# Patient Record
Sex: Male | Born: 1955
Health system: Southern US, Community
[De-identification: ages and names within clinical notes are randomized; demographics above are authoritative.]

## PROBLEM LIST (undated history)

## (undated) DIAGNOSIS — K760 Fatty (change of) liver, not elsewhere classified: Secondary | ICD-10-CM

## (undated) DIAGNOSIS — Z8619 Personal history of other infectious and parasitic diseases: Secondary | ICD-10-CM

## (undated) DIAGNOSIS — Z923 Personal history of irradiation: Secondary | ICD-10-CM

## (undated) DIAGNOSIS — Z86010 Personal history of colonic polyps: Secondary | ICD-10-CM

## (undated) DIAGNOSIS — E119 Type 2 diabetes mellitus without complications: Secondary | ICD-10-CM

## (undated) DIAGNOSIS — F17200 Nicotine dependence, unspecified, uncomplicated: Secondary | ICD-10-CM

## (undated) DIAGNOSIS — I10 Essential (primary) hypertension: Secondary | ICD-10-CM

## (undated) DIAGNOSIS — E785 Hyperlipidemia, unspecified: Secondary | ICD-10-CM

## (undated) DIAGNOSIS — M1990 Unspecified osteoarthritis, unspecified site: Secondary | ICD-10-CM

## (undated) DIAGNOSIS — C61 Malignant neoplasm of prostate: Secondary | ICD-10-CM

## (undated) DIAGNOSIS — G2581 Restless legs syndrome: Secondary | ICD-10-CM

## (undated) DIAGNOSIS — N529 Male erectile dysfunction, unspecified: Secondary | ICD-10-CM

## (undated) HISTORY — DX: Male erectile dysfunction, unspecified: N52.9

## (undated) HISTORY — DX: Restless legs syndrome: G25.81

## (undated) HISTORY — DX: Essential (primary) hypertension: I10

## (undated) HISTORY — DX: Nicotine dependence, unspecified, uncomplicated: F17.200

## (undated) HISTORY — DX: Type 2 diabetes mellitus without complications: E11.9

## (undated) HISTORY — DX: Hyperlipidemia, unspecified: E78.5

## (undated) HISTORY — DX: Personal history of colonic polyps: Z86.010

## (undated) HISTORY — DX: Malignant neoplasm of prostate: C61

## (undated) HISTORY — DX: Fatty (change of) liver, not elsewhere classified: K76.0

## (undated) HISTORY — DX: Unspecified osteoarthritis, unspecified site: M19.90

## (undated) HISTORY — PX: COLONOSCOPY: SHX174

## (undated) HISTORY — PX: CIRCUMCISION: SUR203

## (undated) HISTORY — DX: Personal history of other infectious and parasitic diseases: Z86.19

## (undated) HISTORY — PX: COLONOSCOPY W/ POLYPECTOMY: SHX1380

---

## 2012-02-18 HISTORY — PX: PROSTATE BIOPSY: SHX241

## 2014-03-07 ENCOUNTER — Ambulatory Visit (INDEPENDENT_AMBULATORY_CARE_PROVIDER_SITE_OTHER): Payer: Medicare Other | Admitting: Family Medicine

## 2014-03-07 ENCOUNTER — Encounter: Payer: Self-pay | Admitting: Family Medicine

## 2014-03-07 VITALS — BP 144/94 | HR 79 | Temp 97.0°F | Resp 18 | Ht 68.0 in | Wt 187.0 lb

## 2014-03-07 DIAGNOSIS — G2581 Restless legs syndrome: Secondary | ICD-10-CM | POA: Insufficient documentation

## 2014-03-07 DIAGNOSIS — E785 Hyperlipidemia, unspecified: Secondary | ICD-10-CM | POA: Insufficient documentation

## 2014-03-07 DIAGNOSIS — Z8619 Personal history of other infectious and parasitic diseases: Secondary | ICD-10-CM

## 2014-03-07 DIAGNOSIS — I10 Essential (primary) hypertension: Secondary | ICD-10-CM | POA: Insufficient documentation

## 2014-03-07 MED ORDER — ROPINIROLE HCL 0.5 MG PO TABS: 0.5000 mg | ORAL_TABLET | Freq: Three times a day (TID) | ORAL | Status: DC

## 2014-03-07 MED ORDER — BENAZEPRIL HCL 20 MG PO TABS: 20.0000 mg | ORAL_TABLET | Freq: Every day | ORAL | Status: DC

## 2014-03-07 NOTE — Progress Notes (Signed)
Office Note 03/07/2014  CC:  Chief Complaint  Patient presents with  . Establish Care    pt doesn't plan to stay at this office?    HPI:  John Sloan is a 58 y.o. Black male who is here to establish care. Patient's most recent primary MD: MD in [place] records were not reviewed prior to or during today's visit.  Monitoring bp at home: last few weeks >150 over 80s-90s.    Says ropinirole not working now, has been on it for about 10 yrs. About 3 yrs ago he noted it was not working as well.  His dose was increased but then in review of his current dosing he takes only a 0.25mg  tab qhs and sometimes a dose in morning and afternoon prn.   Past Medical History  Diagnosis Date  . HTN (hypertension)   . Hyperlipemia   . Erectile dysfunction   . Restless legs syndrome   . Osteoarthritis     Left knee, right hand, left shoulder--fluctuates with weather.   . History of adenomatous polyp of colon   . History of elevated PSA     Bx x 1 benign.    . Fatty liver     bx done per pt  . History of hepatitis C virus infection     treated 2014--cured per pt    Past Surgical History  Procedure Laterality Date  . Colonoscopy w/ polypectomy  approx 2012    1 polyp : recall 5 yrs    Family History  Problem Relation Age of Onset  . Hypertension Mother   . Hypertension Father     History   Social History  . Marital Status: Married    Spouse Name: N/A    Number of Children: N/A  . Years of Education: N/A   Occupational History  . Not on file.   Social History Main Topics  . Smoking status: Current Every Day Smoker -- 0.50 packs/day for 40 years    Types: Cigarettes  . Smokeless tobacco: Never Used  . Alcohol Use: Yes     Comment: socially  . Drug Use: No  . Sexual Activity: Not on file   Other Topics Concern  . Not on file   Social History Narrative   Married, 5 children, 21 grandchildren.   Relocated from [place] to [facility] 2015.   Occupation: disabled,  former Therapist, occupational.   20 pack-yr tob hx, current as of 03/2014.   Alcohol: drinks about 1 case per week.  Denies probs from his drinking.   Drugs: hx of cocaine and marijuana abuse.  (IV drug use in the past).   Still smokes marijuana occasionally.  No cocaine since 2009.             Outpatient Encounter Prescriptions as of 03/07/2014  Medication Sig  . amLODipine (NORVASC) 10 MG tablet Take 10 mg by mouth daily.  Marland Kitchen aspirin 81 MG tablet Take 81 mg by mouth daily.  . benazepril (LOTENSIN) 20 MG tablet Take 1 tablet (20 mg total) by mouth daily.  . hydrochlorothiazide (HYDRODIURIL) 25 MG tablet Take 25 mg by mouth daily.  Marland Kitchen lovastatin (MEVACOR) 40 MG tablet Take 40 mg by mouth at bedtime.  . naproxen (NAPROSYN) 500 MG tablet Take 500 mg by mouth 2 (two) times daily with a meal.  . rOPINIRole (REQUIP) 0.5 MG tablet Take 1 tablet (0.5 mg total) by mouth 3 (three) times daily.  . sildenafil (VIAGRA) 50 MG tablet Take 50 mg  by mouth as needed for erectile dysfunction.  . [DISCONTINUED] benazepril (LOTENSIN) 10 MG tablet Take 10 mg by mouth daily.  . [DISCONTINUED] rOPINIRole (REQUIP) 0.25 MG tablet Take 0.25 mg by mouth 3 (three) times daily.    No Known Allergies  ROS Review of Systems  Constitutional: Negative for fever and fatigue.  HENT: Negative for congestion and sore throat.   Eyes: Negative for visual disturbance.  Respiratory: Negative for cough and shortness of breath.   Cardiovascular: Negative for chest pain and palpitations.  Gastrointestinal: Negative for nausea and abdominal pain.  Genitourinary: Negative for dysuria.  Musculoskeletal: Negative for back pain and joint swelling.       Left shoulder and arm heaviness intermittently  Skin: Negative for rash.  Neurological: Negative for weakness and headaches.  Hematological: Negative for adenopathy.    PE; Blood pressure 144/94, pulse 79, temperature 97 F (36.1 C), temperature source Temporal, resp. rate 18,  height 5\' 8"  (1.727 m), weight 187 lb (84.823 kg), SpO2 100.00%. Gen: Alert, well appearing.  Patient is oriented to person, place, time, and situation. WVP:XTGG: no injection, icteris, swelling, or exudate.  EOMI, PERRLA. Mouth: lips without lesion/swelling.  Oral mucosa pink and moist. Oropharynx without erythema, exudate, or swelling.  CV: RRR, no m/r/g.   LUNGS: CTA bilat, nonlabored resps, good aeration in all lung fields. ABD: soft, NT, ND, BS normal.  No hepatospenomegaly or mass.  No bruits. EXT: no clubbing, cyanosis, or edema.   Pertinent labs:  None today  ASSESSMENT AND PLAN:   New pt: obtain old records.  Restless leg syndrome, uncontrolled Increase generic mirapex to 0.5mg  and take 1 tid prn.  May need to keep up-titrating this, particularly his hs dose.  HTN (hypertension), benign Not ideal control. Increase benazepril to 20mg  qd.  Continue 25mg  hctz and 10mg  norvasc. Check CMET today.  History of hepatitis C virus infection Will review old records. Pt states he has been treated and cured.  Hyperlipemia Tolerating statin. AST/ALT today.    An After Visit Summary was printed and given to the patient.  Return in about 3 weeks (around 03/28/2014) for f/u RLS and HTN and discuss left shoulder.

## 2014-03-07 NOTE — Assessment & Plan Note (Signed)
Will review old records. Pt states he has been treated and cured.

## 2014-03-07 NOTE — Assessment & Plan Note (Signed)
Increase generic mirapex to 0.5mg  and take 1 tid prn.  May need to keep up-titrating this, particularly his hs dose.

## 2014-03-07 NOTE — Assessment & Plan Note (Signed)
Not ideal control. Increase benazepril to 20mg  qd.  Continue 25mg  hctz and 10mg  norvasc. Check CMET today.

## 2014-03-07 NOTE — Assessment & Plan Note (Signed)
Tolerating statin. AST/ALT today.

## 2014-03-07 NOTE — Progress Notes (Signed)
Pre visit review using our clinic review tool, if applicable. No additional management support is needed unless otherwise documented below in the visit note. 

## 2014-03-08 ENCOUNTER — Encounter: Payer: Self-pay | Admitting: Family Medicine

## 2014-03-08 ENCOUNTER — Telehealth: Payer: Self-pay | Admitting: Family Medicine

## 2014-03-08 DIAGNOSIS — C61 Malignant neoplasm of prostate: Secondary | ICD-10-CM | POA: Insufficient documentation

## 2014-03-08 LAB — COMPREHENSIVE METABOLIC PANEL
ALK PHOS: 42 U/L (ref 39–117)
ALT: 35 U/L (ref 0–53)
AST: 34 U/L (ref 0–37)
Albumin: 4 g/dL (ref 3.5–5.2)
BUN: 10 mg/dL (ref 6–23)
CO2: 30 mEq/L (ref 19–32)
Calcium: 9.4 mg/dL (ref 8.4–10.5)
Chloride: 99 mEq/L (ref 96–112)
Creatinine, Ser: 1 mg/dL (ref 0.4–1.5)
GFR: 77.46 mL/min (ref >60.00)
Glucose, Bld: 133 mg/dL — ABNORMAL HIGH (ref 70–99)
Potassium: 3.3 mEq/L — ABNORMAL LOW (ref 3.5–5.1)
SODIUM: 136 meq/L (ref 135–145)
TOTAL PROTEIN: 7.7 g/dL (ref 6.0–8.3)
Total Bilirubin: 0.9 mg/dL (ref 0.2–1.2)

## 2014-03-08 LAB — CBC WITH DIFFERENTIAL/PLATELET
Basophils Absolute: 0 10*3/uL (ref 0.0–0.1)
Basophils Relative: 0.4 % (ref 0.0–3.0)
EOS ABS: 0.2 10*3/uL (ref 0.0–0.7)
Eosinophils Relative: 2.3 % (ref 0.0–5.0)
HCT: 45.6 % (ref 39.0–52.0)
Hemoglobin: 15 g/dL (ref 13.0–17.0)
Lymphocytes Relative: 30.3 % (ref 12.0–46.0)
Lymphs Abs: 2.3 10*3/uL (ref 0.7–4.0)
MCHC: 33 g/dL (ref 30.0–36.0)
MCV: 86.9 fl (ref 78.0–100.0)
MONO ABS: 0.6 10*3/uL (ref 0.1–1.0)
Monocytes Relative: 8.1 % (ref 3.0–12.0)
Neutro Abs: 4.5 10*3/uL (ref 1.4–7.7)
Neutrophils Relative %: 58.9 % (ref 43.0–77.0)
PLATELETS: 323 10*3/uL (ref 150.0–400.0)
RBC: 5.24 Mil/uL (ref 4.22–5.81)
RDW: 16.1 % — AB (ref 11.5–15.5)
WBC: 7.7 10*3/uL (ref 4.0–10.5)

## 2014-03-08 LAB — FERRITIN: Ferritin: 14.6 ng/mL — ABNORMAL LOW (ref 22.0–322.0)

## 2014-03-08 NOTE — Telephone Encounter (Signed)
Relevant patient education assigned to patient using Emmi. ° °

## 2014-03-09 ENCOUNTER — Ambulatory Visit: Payer: Medicare Other

## 2014-03-09 DIAGNOSIS — R7309 Other abnormal glucose: Secondary | ICD-10-CM

## 2014-03-09 LAB — HEMOGLOBIN A1C: HEMOGLOBIN A1C: 5.9 % (ref 4.6–6.5)

## 2014-03-30 ENCOUNTER — Ambulatory Visit: Payer: Medicare Other | Admitting: Family Medicine

## 2014-03-30 ENCOUNTER — Encounter: Payer: Self-pay | Admitting: Family Medicine

## 2014-03-30 ENCOUNTER — Ambulatory Visit (INDEPENDENT_AMBULATORY_CARE_PROVIDER_SITE_OTHER): Payer: Medicare Other | Admitting: Family Medicine

## 2014-03-30 VITALS — BP 119/79 | HR 60 | Temp 97.2°F | Resp 18 | Ht 68.0 in | Wt 193.0 lb

## 2014-03-30 DIAGNOSIS — G2581 Restless legs syndrome: Secondary | ICD-10-CM

## 2014-03-30 DIAGNOSIS — E876 Hypokalemia: Secondary | ICD-10-CM

## 2014-03-30 DIAGNOSIS — E785 Hyperlipidemia, unspecified: Secondary | ICD-10-CM

## 2014-03-30 DIAGNOSIS — I10 Essential (primary) hypertension: Secondary | ICD-10-CM

## 2014-03-30 DIAGNOSIS — E611 Iron deficiency: Secondary | ICD-10-CM

## 2014-03-30 DIAGNOSIS — D509 Iron deficiency anemia, unspecified: Secondary | ICD-10-CM

## 2014-03-30 LAB — BASIC METABOLIC PANEL
BUN: 14 mg/dL (ref 6–23)
CO2: 30 mEq/L (ref 19–32)
Calcium: 9.6 mg/dL (ref 8.4–10.5)
Chloride: 100 mEq/L (ref 96–112)
Creatinine, Ser: 1 mg/dL (ref 0.4–1.5)
GFR: 84.94 mL/min (ref >60.00)
GLUCOSE: 59 mg/dL — AB (ref 70–99)
POTASSIUM: 3.6 meq/L (ref 3.5–5.1)
SODIUM: 136 meq/L (ref 135–145)

## 2014-03-30 MED ORDER — ROPINIROLE HCL 0.5 MG PO TABS: ORAL_TABLET | ORAL | Status: DC

## 2014-03-30 MED ORDER — LOVASTATIN 40 MG PO TABS: 40.0000 mg | ORAL_TABLET | Freq: Every day | ORAL | Status: DC

## 2014-03-30 NOTE — Progress Notes (Signed)
OFFICE NOTE  03/30/2014  CC:  Chief Complaint  Patient presents with  . Follow-up  . Medication Problem    pt requesting 1mg  requip   HPI: Patient is a 58 y.o. African-American male who is here for 3 wk f/u uncontrolled HTN and RLS. Increased lotensin to 20mg  qd last visit. Home bp's 120s/70s.  HR avg mid 70s.  Requip is helping daytime RLS sx's but lately the 0.5mg  hs dose has not been sufficient. Describes "jumpy" left leg and left upper/medial thigh intermittent jabbing pains.  Right leg is w/out sx's. His iron was low so he is on [id number] bid and will go to qd dosing in about 1 wk. His Hb was normal.  Stool was brown prior to taking iron tabs: now it is black per pt.  Takes naproxen about 3 x/week for shoulder arthralgia and takes daily 81mg  ASA.   Pertinent PMH:  Past medical, surgical, social, and family history reviewed and no changes are noted since last office visit.  MEDS:  Outpatient Prescriptions Prior to Visit  Medication Sig Dispense Refill  . amLODipine (NORVASC) 10 MG tablet Take 10 mg by mouth daily.      Marland Kitchen aspirin 81 MG tablet Take 81 mg by mouth daily.      . benazepril (LOTENSIN) 20 MG tablet Take 1 tablet (20 mg total) by mouth daily.  30 tablet  6  . hydrochlorothiazide (HYDRODIURIL) 25 MG tablet Take 25 mg by mouth daily.      Marland Kitchen lovastatin (MEVACOR) 40 MG tablet Take 40 mg by mouth at bedtime.      . naproxen (NAPROSYN) 500 MG tablet Take 500 mg by mouth 2 (two) times daily with a meal.      . rOPINIRole (REQUIP) 0.5 MG tablet Take 1 tablet (0.5 mg total) by mouth 3 (three) times daily.  90 tablet  1  . sildenafil (VIAGRA) 50 MG tablet Take 50 mg by mouth as needed for erectile dysfunction.       No facility-administered medications prior to visit.    PE: Blood pressure 119/79, pulse 60, temperature 97.2 F (36.2 C), temperature source Temporal, resp. rate 18, height 5\' 8"  (1.727 m), weight 193 lb (87.544 kg), SpO2 100.00%. Gen: Alert, well  appearing.  Patient is oriented to person, place, time, and situation. CV: RRR, no m/r/g.   LUNGS: CTA bilat, nonlabored resps, good aeration in all lung fields. LEGS: strength 5/5 prox and dist bilat, DTRs in patella region 2+ bilat, No back or hip tenderness. I observed no muscle twitching or myoclonus today.  IMPRESSION AND PLAN:  1) HTN, controlled well now. Continue current meds.  2) mild hypokalemia.  I increased his dose of lotensin so we'll see if K in normal range now.  BMET today.  3) Hyperlipidemia: he can't recall last lipid check, no old records received yet from past PMD, not fasting today. RF lovastatin 40mg  qd today, recheck FLP next f/u in 1 mo.  4) Iron deficiency w/out anemia: continue iron and hopefully this will help his RLS. Do hemoccults x 3. Recheck CBC next f/u 1 mo.  5) RLS, poor control hs: increase hs dose to two of the 0.5mg  tabs for a few days and if not helpful then increase to 3 tabs qhs.  He declined flu vaccine that I recommended today.  An After Visit Summary was printed and given to the patient.  FOLLOW UP: 1 mo

## 2014-03-30 NOTE — Progress Notes (Signed)
Pre visit review using our clinic review tool, if applicable. No additional management support is needed unless otherwise documented below in the visit note. 

## 2014-04-03 ENCOUNTER — Encounter: Payer: Self-pay | Admitting: Family Medicine

## 2014-04-17 ENCOUNTER — Other Ambulatory Visit: Payer: Self-pay

## 2014-04-17 DIAGNOSIS — Z1211 Encounter for screening for malignant neoplasm of colon: Secondary | ICD-10-CM

## 2014-04-17 LAB — POC HEMOCCULT BLD/STL (HOME/3-CARD/SCREEN)
Card #3 Fecal Occult Blood, POC: NEGATIVE
FECAL OCCULT BLD: NEGATIVE
FECAL OCCULT BLD: NEGATIVE

## 2014-04-19 ENCOUNTER — Encounter: Payer: Self-pay | Admitting: Family Medicine

## 2014-04-19 ENCOUNTER — Telehealth: Payer: Self-pay | Admitting: Family Medicine

## 2014-04-19 MED ORDER — AMLODIPINE BESYLATE 10 MG PO TABS: 10.0000 mg | ORAL_TABLET | Freq: Every day | ORAL | Status: DC

## 2014-04-19 MED ORDER — GLIMEPIRIDE 2 MG PO TABS: 2.0000 mg | ORAL_TABLET | Freq: Every day | ORAL | Status: DC

## 2014-04-19 MED ORDER — HYDROCHLOROTHIAZIDE 25 MG PO TABS: 25.0000 mg | ORAL_TABLET | Freq: Every day | ORAL | Status: DC

## 2014-04-19 NOTE — Telephone Encounter (Signed)
Patient needs refills for all three meds. The Glimepiride was Rx'd by his previous PCP in [place].

## 2014-04-19 NOTE — Telephone Encounter (Signed)
Confirm with pt that he knows this is a diabetes med---our history shows we were not informed of him having this dx. Also confirm exact dosing instructions (mg and how often).-thx

## 2014-04-19 NOTE — Telephone Encounter (Signed)
Pt states he has been taking this med for about 8 months.  He was told he was borderline DM.  Rx sent into pharmacy.

## 2014-04-19 NOTE — Telephone Encounter (Signed)
Please advise.  Glimepiride is not on his current med list.

## 2014-04-27 ENCOUNTER — Encounter: Payer: Self-pay | Admitting: Family Medicine

## 2014-04-27 ENCOUNTER — Ambulatory Visit (INDEPENDENT_AMBULATORY_CARE_PROVIDER_SITE_OTHER): Payer: Medicare Other | Admitting: Family Medicine

## 2014-04-27 VITALS — BP 126/79 | HR 70 | Temp 98.1°F | Wt 195.0 lb

## 2014-04-27 DIAGNOSIS — E611 Iron deficiency: Secondary | ICD-10-CM

## 2014-04-27 DIAGNOSIS — G2581 Restless legs syndrome: Secondary | ICD-10-CM

## 2014-04-27 DIAGNOSIS — D509 Iron deficiency anemia, unspecified: Secondary | ICD-10-CM

## 2014-04-27 DIAGNOSIS — C61 Malignant neoplasm of prostate: Secondary | ICD-10-CM

## 2014-04-27 DIAGNOSIS — Z7189 Other specified counseling: Secondary | ICD-10-CM

## 2014-04-27 DIAGNOSIS — E785 Hyperlipidemia, unspecified: Secondary | ICD-10-CM

## 2014-04-27 DIAGNOSIS — Z716 Tobacco abuse counseling: Secondary | ICD-10-CM

## 2014-04-27 DIAGNOSIS — I10 Essential (primary) hypertension: Secondary | ICD-10-CM

## 2014-04-27 DIAGNOSIS — F17200 Nicotine dependence, unspecified, uncomplicated: Secondary | ICD-10-CM

## 2014-04-27 LAB — CBC WITH DIFFERENTIAL/PLATELET
BASOS ABS: 0.1 10*3/uL (ref 0.0–0.1)
Basophils Relative: 1.3 % (ref 0.0–3.0)
Eosinophils Absolute: 0.1 10*3/uL (ref 0.0–0.7)
Eosinophils Relative: 1.6 % (ref 0.0–5.0)
HEMATOCRIT: 47.1 % (ref 39.0–52.0)
Hemoglobin: 15.4 g/dL (ref 13.0–17.0)
LYMPHS ABS: 2.9 10*3/uL (ref 0.7–4.0)
Lymphocytes Relative: 34.9 % (ref 12.0–46.0)
MCHC: 32.7 g/dL (ref 30.0–36.0)
MCV: 89.4 fl (ref 78.0–100.0)
MONO ABS: 0.6 10*3/uL (ref 0.1–1.0)
Monocytes Relative: 7.1 % (ref 3.0–12.0)
NEUTROS PCT: 55.1 % (ref 43.0–77.0)
Neutro Abs: 4.6 10*3/uL (ref 1.4–7.7)
PLATELETS: 355 10*3/uL (ref 150.0–400.0)
RBC: 5.27 Mil/uL (ref 4.22–5.81)
RDW: 17.3 % — ABNORMAL HIGH (ref 11.5–15.5)
WBC: 8.4 10*3/uL (ref 4.0–10.5)

## 2014-04-27 LAB — LIPID PANEL
CHOL/HDL RATIO: 4
Cholesterol: 166 mg/dL (ref 0–200)
HDL: 46.1 mg/dL (ref >39.00)
LDL CALC: 98 mg/dL (ref 0–99)
NONHDL: 119.9
Triglycerides: 109 mg/dL (ref 0.0–149.0)
VLDL: 21.8 mg/dL (ref 0.0–40.0)

## 2014-04-27 LAB — IBC PANEL
Iron: 77 ug/dL (ref 42–165)
Saturation Ratios: 17.2 % — ABNORMAL LOW (ref 20.0–50.0)
Transferrin: 319.1 mg/dL (ref 212.0–360.0)

## 2014-04-27 LAB — FERRITIN: FERRITIN: 35.6 ng/mL (ref 22.0–322.0)

## 2014-04-27 MED ORDER — NICOTINE POLACRILEX 2 MG MT LOZG: 2.0000 mg | LOZENGE | OROMUCOSAL | Status: DC | PRN

## 2014-04-27 MED ORDER — LOVASTATIN 40 MG PO TABS: 40.0000 mg | ORAL_TABLET | Freq: Every day | ORAL | Status: DC

## 2014-04-27 MED ORDER — NICOTINE 14 MG/24HR TD PT24: 14.0000 mg | MEDICATED_PATCH | Freq: Every day | TRANSDERMAL | Status: DC

## 2014-04-27 MED ORDER — NICOTINE 7 MG/24HR TD PT24: 7.0000 mg | MEDICATED_PATCH | Freq: Every day | TRANSDERMAL | Status: DC

## 2014-04-27 NOTE — Progress Notes (Signed)
OFFICE NOTE  04/27/2014  CC:  Chief Complaint  Patient presents with  . Follow-up    4 week f/u   HPI: Patient is a 58 y.o. African-American male who is here for 1 mo f/u HTN, iron def w/out anemia (hemoccults neg x 3 04/2014), and RLS.  RLS getting better--is on 2 mirapex qd.  No bp monitoring at home to report. Takes chronic meds and denies side effects.  Also, hx of prostate cancer, needs surveillance PSA today.  He asks for smoking cessation help today:  Smokes [place] menthol, 1 ppd x many years.  Wants to quit, asks for rx's for nicotine patches.  Has never tried quitting with the aid of meds/nicotine in the past.   Pertinent PMH:  Past medical, surgical, social, and family history reviewed and no changes are noted since last office visit.  MEDS:  Outpatient Prescriptions Prior to Visit  Medication Sig Dispense Refill  . amLODipine (NORVASC) 10 MG tablet Take 1 tablet (10 mg total) by mouth daily.  30 tablet  3  . aspirin 81 MG tablet Take 81 mg by mouth daily.      . benazepril (LOTENSIN) 20 MG tablet Take 1 tablet (20 mg total) by mouth daily.  30 tablet  6  . ferrous sulfate 325 (65 FE) MG tablet Take 325 mg by mouth 2 (two) times daily with a meal.      . glimepiride (AMARYL) 2 MG tablet Take 1 tablet (2 mg total) by mouth daily with breakfast.  90 tablet  4  . hydrochlorothiazide (HYDRODIURIL) 25 MG tablet Take 1 tablet (25 mg total) by mouth daily.  30 tablet  3  . naproxen (NAPROSYN) 500 MG tablet Take 500 mg by mouth 2 (two) times daily with a meal.      . rOPINIRole (REQUIP) 0.5 MG tablet 1 tab po qAM, 1 tab po q Mid-day, and 2-3 tabs po qhs  150 tablet  6  . lovastatin (MEVACOR) 40 MG tablet Take 1 tablet (40 mg total) by mouth at bedtime.  30 tablet  6  . sildenafil (VIAGRA) 50 MG tablet Take 50 mg by mouth as needed for erectile dysfunction.       No facility-administered medications prior to visit.    PE: Blood pressure 126/79, pulse 70, temperature 98.1 F  (36.7 C), temperature source Oral, weight 195 lb (88.451 kg), SpO2 99.00%. Gen: Alert, well appearing.  Patient is oriented to person, place, time, and situation. AFFECT: pleasant, lucid thought and speech. No further exam today.  IMPRESSION AND PLAN:  Encounter for tobacco use cessation counseling Pick a quit date. On quit date, start nicoderm 14 mg patch qd and use nicotine lozenge 2mg  prn cigarette craving. In one month down-titrate to 7 mg patch qd for 1 month.  Rx's for these patches and lozenges given to pt today.  Restless leg syndrome, uncontrolled Improved.  Continue mirapex 2-3 of the 0.125mg  tabs qhs and continue iron replacement.  HTN (hypertension), benign Stable. Continue 3 med regimen.  Hyperlipemia Tolerating statin. Check FLP today.  Prostate cancer As per old records: Pre dx PSA was 4.6. Gleeson 3+3=6. Active surveillance: next PSA needed TODAY. Will also refer to [facility].    An After Visit Summary was printed and given to the patient.  FOLLOW UP: 6 mo

## 2014-04-27 NOTE — Progress Notes (Signed)
Pre visit review using our clinic review tool, if applicable. No additional management support is needed unless otherwise documented below in the visit note. 

## 2014-04-30 ENCOUNTER — Encounter: Payer: Self-pay | Admitting: Family Medicine

## 2014-04-30 ENCOUNTER — Telehealth: Payer: Self-pay | Admitting: Family Medicine

## 2014-04-30 DIAGNOSIS — E611 Iron deficiency: Secondary | ICD-10-CM | POA: Insufficient documentation

## 2014-04-30 DIAGNOSIS — Z716 Tobacco abuse counseling: Secondary | ICD-10-CM | POA: Insufficient documentation

## 2014-04-30 NOTE — Telephone Encounter (Signed)
Pls add PSA test on to blood draw done on John Sloan 04/27/14. Dx is prostate cancer.-thx

## 2014-04-30 NOTE — Assessment & Plan Note (Signed)
Stable. Continue 3 med regimen.

## 2014-04-30 NOTE — Assessment & Plan Note (Addendum)
Improved.  Continue mirapex 2-3 of the 0.125mg  tabs qhs and continue iron replacement.

## 2014-04-30 NOTE — Assessment & Plan Note (Signed)
Without anemia. Iron bid for the last month, and has switched over to once daily FeSO4 325mg  qd now. Hemoccults x 3 have been negative. Recheck CBC with IBC panel and ferritin today.

## 2014-04-30 NOTE — Assessment & Plan Note (Signed)
As per old records: Pre dx PSA was 4.6. Gleeson 3+3=6. Active surveillance: next PSA needed TODAY. Will also refer to [facility].

## 2014-04-30 NOTE — Assessment & Plan Note (Signed)
Pick a quit date. On quit date, start nicoderm 14 mg patch qd and use nicotine lozenge 2mg  prn cigarette craving. In one month down-titrate to 7 mg patch qd for 1 month.  Rx's for these patches and lozenges given to pt today.

## 2014-04-30 NOTE — Assessment & Plan Note (Signed)
Tolerating statin. Check FLP today.

## 2014-05-01 LAB — PSA: PSA: 4.66 ng/mL — ABNORMAL HIGH (ref 0.10–4.00)

## 2014-05-01 NOTE — Addendum Note (Signed)
Addended by: Jannette Spanner on: 05/01/2014 09:20 AM   Modules accepted: Orders

## 2014-05-01 NOTE — Telephone Encounter (Signed)
Test added.   

## 2014-05-02 ENCOUNTER — Other Ambulatory Visit: Payer: Self-pay

## 2014-05-02 ENCOUNTER — Other Ambulatory Visit: Payer: Medicare Other

## 2014-05-02 ENCOUNTER — Encounter: Payer: Self-pay | Admitting: Family Medicine

## 2014-05-02 DIAGNOSIS — R972 Elevated prostate specific antigen [PSA]: Secondary | ICD-10-CM

## 2014-05-03 ENCOUNTER — Encounter: Payer: Self-pay | Admitting: Family Medicine

## 2014-05-03 LAB — PSA, FREE: PSA, Free: 0.41 ng/mL

## 2014-07-26 ENCOUNTER — Encounter: Payer: Self-pay | Admitting: Family Medicine

## 2014-08-10 ENCOUNTER — Other Ambulatory Visit: Payer: Self-pay | Admitting: Urology

## 2014-08-22 ENCOUNTER — Other Ambulatory Visit: Payer: Self-pay | Admitting: Family Medicine

## 2014-08-22 MED ORDER — AMLODIPINE BESYLATE 10 MG PO TABS: 10.0000 mg | ORAL_TABLET | Freq: Every day | ORAL | Status: DC

## 2014-08-22 MED ORDER — HYDROCHLOROTHIAZIDE 25 MG PO TABS: 25.0000 mg | ORAL_TABLET | Freq: Every day | ORAL | Status: DC

## 2014-09-04 NOTE — Patient Instructions (Addendum)
John Sloan  09/04/2014   Your procedure is scheduled on: 09/11/14   Report to [facility] Main  Entrance and follow signs to               [place] at 12:45 PM.   Call this number if you have problems the morning of surgery [phone]   Remember:  Do not eat food  :After Midnight.              MAY HAVE CLEAR LIQUIDS UNTIL 8:45 AM    CLEAR LIQUID DIET   Foods Allowed                                                                     Foods Excluded  Coffee and tea, regular and decaf                             liquids that you cannot  Plain Jell-O in any flavor                                             see through such as: Fruit ices (not with fruit pulp)                                     milk, soups, orange juice  Iced Popsicles                                                All solid food Carbonated beverages, regular and diet                                    Cranberry, grape and apple juices Sports drinks like Gatorade Lightly seasoned clear broth or consume(fat free) Sugar, honey syrup  _____________________________________________________________________   Take these medicines the morning of surgery with A SIP OF WATER: AMLODIPINE / MAY TAKE REQUIP                                You may not have any metal on your body including hair pins and              piercings  Do not wear jewelry, make-up, lotions, powders or perfumes.             Do not wear nail polish.  Do not shave  48 hours prior to surgery.              Men may shave face and neck.   Do not bring valuables to the hospital. [place].  Contacts, dentures or bridgework may not be worn into surgery.  Leave suitcase in the car. After surgery it may be brought to your room.     Patients discharged the day of surgery will not be allowed to drive home.  Name and phone number of your driver:  Special Instructions:  N/A              Please read over the following fact sheets you were given: _____________________________________________________________________                                                     [place]  Before surgery, you can play an important role.  Because skin is not sterile, your skin needs to be as free of germs as possible.  You can reduce the number of germs on your skin by washing with CHG (chlorahexidine gluconate) soap before surgery.  CHG is an antiseptic cleaner which kills germs and bonds with the skin to continue killing germs even after washing. Please DO NOT use if you have an allergy to CHG or antibacterial soaps.  If your skin becomes reddened/irritated stop using the CHG and inform your nurse when you arrive at Short Stay. Do not shave (including legs and underarms) for at least 48 hours prior to the first CHG shower.  You may shave your face. Please follow these instructions carefully:   1.  Shower with CHG Soap the night before surgery and the  morning of Surgery.   2.  If you choose to wash your hair, wash your hair first as usual with your  normal  Shampoo.   3.  After you shampoo, rinse your hair and body thoroughly to remove the  shampoo.                                         4.  Use CHG as you would any other liquid soap.  You can apply chg directly  to the skin and wash . Gently wash with scrungie or clean wascloth    5.  Apply the CHG Soap to your body ONLY FROM THE NECK DOWN.   Do not use on open                           Wound or open sores. Avoid contact with eyes, ears mouth and genitals (private parts).                        Genitals (private parts) with your normal soap.              6.  Wash thoroughly, paying special attention to the area where your surgery  will be performed.   7.  Thoroughly rinse your body with warm water from the neck down.   8.  DO NOT shower/wash with your normal soap after using and rinsing  off  the CHG Soap .                9.  Pat yourself dry with a clean towel.             10.  Wear clean pajamas.  11.  Place clean sheets on your bed the night of your first shower and do not  sleep with pets.  Day of Surgery : Do not apply any lotions/deodorants the morning of surgery.  Please wear clean clothes to the hospital/surgery center.  FAILURE TO FOLLOW THESE INSTRUCTIONS MAY RESULT IN THE CANCELLATION OF YOUR SURGERY    PATIENT SIGNATURE_________________________________  ______________________________________________________________________

## 2014-09-05 ENCOUNTER — Ambulatory Visit (HOSPITAL_COMMUNITY)
Admission: RE | Admit: 2014-09-05 | Discharge: 2014-09-05 | Disposition: A | Discharge status: Home / Self Care | Payer: Medicare Other | Source: Ambulatory Visit | Attending: Anesthesiology | Admitting: Anesthesiology

## 2014-09-05 ENCOUNTER — Encounter (HOSPITAL_COMMUNITY)
Admission: RE | Admit: 2014-09-05 | Discharge: 2014-09-05 | Disposition: A | Discharge status: Home / Self Care | Payer: Medicare Other | Source: Ambulatory Visit | Attending: Urology | Admitting: Urology

## 2014-09-05 ENCOUNTER — Other Ambulatory Visit: Payer: Self-pay

## 2014-09-05 ENCOUNTER — Encounter (HOSPITAL_COMMUNITY): Payer: Self-pay

## 2014-09-05 DIAGNOSIS — I10 Essential (primary) hypertension: Secondary | ICD-10-CM | POA: Diagnosis not present

## 2014-09-05 DIAGNOSIS — C61 Malignant neoplasm of prostate: Secondary | ICD-10-CM | POA: Insufficient documentation

## 2014-09-05 DIAGNOSIS — Z01818 Encounter for other preprocedural examination: Secondary | ICD-10-CM | POA: Diagnosis not present

## 2014-09-05 DIAGNOSIS — Z87891 Personal history of nicotine dependence: Secondary | ICD-10-CM | POA: Insufficient documentation

## 2014-09-05 LAB — CBC
HEMATOCRIT: 45.8 % (ref 39.0–52.0)
Hemoglobin: 15.6 g/dL (ref 13.0–17.0)
MCH: 29.4 pg (ref 26.0–34.0)
MCHC: 34.1 g/dL (ref 30.0–36.0)
MCV: 86.3 fL (ref 78.0–100.0)
Platelets: 303 10*3/uL (ref 150–400)
RBC: 5.31 MIL/uL (ref 4.22–5.81)
RDW: 14.7 % (ref 11.5–15.5)
WBC: 7.5 10*3/uL (ref 4.0–10.5)

## 2014-09-05 LAB — BASIC METABOLIC PANEL
ANION GAP: 7 (ref 5–15)
BUN: 11 mg/dL (ref 6–23)
CALCIUM: 9.7 mg/dL (ref 8.4–10.5)
CO2: 30 mmol/L (ref 19–32)
CREATININE: 1.05 mg/dL (ref 0.50–1.35)
Chloride: 100 mmol/L (ref 96–112)
GFR, EST AFRICAN AMERICAN: 87 mL/min — AB (ref >90)
GFR, EST NON AFRICAN AMERICAN: 75 mL/min — AB (ref >90)
GLUCOSE: 70 mg/dL (ref 70–99)
Potassium: 3.4 mmol/L — ABNORMAL LOW (ref 3.5–5.1)
SODIUM: 137 mmol/L (ref 135–145)

## 2014-09-05 NOTE — Progress Notes (Signed)
09-05-14 1645 EKG reviewed by Dr. Lissa Hoard of today, no comparative EKG available,"may proceed as planned"

## 2014-09-05 NOTE — Pre-Procedure Instructions (Signed)
09-05-14 1230 Note to Dr. Lynne Logan per Epic-Potassium 3.4.

## 2014-09-05 NOTE — Progress Notes (Signed)
09-05-14 1230 BMP viewable in Epic, note Potassium 3.4.

## 2014-09-09 NOTE — H&P (Signed)
Chief Complaint Prostate Cancer    History of Present Illness [name] is a 59 year old gentleman with a history of hypertension and hepatitis C who was initially diagnosed with low risk prostate cancer in [place], [place] in July 2013. He underwent a prostate needle biopsy on 02/18/12 for an elevated PSA of 4.6 by Dr. Robert Bellow DeLaurentis and was found to have 2 out of 18 biopsy cores positive for Gleason 3+3=6 adenocarcinoma of the prostate. He chose to proceed with active surveillance and has been undergoing PSA evaluations every 3 months or so without significant change in his PSA values from baseline. He has not undergone a follow up biopsy since beginning surveillance. He has no family history of prostate cancer.     ** He has hepatitis C which has been treated with Interferon previously.    Initial diagnosis: July 2013  TNM stage: cT1c Nx Mx  PSA at diagnosis: 4.6  Gleason score: 3+3=6  Biopsy (02/18/12 - read by Dr. Verlin Dike, [facility] # [id number]): 2/18 cores positive -- L base (25%, 60%)  Prostate volume: 31.1 cc    Surveillance    Baseline urinary function: IPSS is 14.  Erectile function: He does have erectile dysfunction. SHIM score is 19. He is treated with Viagra 50 mg.   Past Medical History Problems  1. History of diabetes mellitus (Z86.39) 2. History of hyperlipidemia (Z86.39) 3. History of hypertension (Z86.79) 4. History of Restless leg syndrome (G25.81)  Surgical History Problems  1. History of No Surgical Problems  Current Meds 1. Amaryl 2 MG Oral Tablet;  Therapy: (Recorded:09Nov2015) to Recorded 2. AmLODIPine Besylate 10 MG Oral Tablet;  Therapy: (Recorded:09Nov2015) to Recorded 3. Aspirin Low Dose 81 MG Oral Tablet;  Therapy: (Recorded:09Nov2015) to Recorded 4. Benazepril HCl - 20 MG Oral Tablet;  Therapy: (Recorded:09Nov2015) to Recorded 5. Flexeril 5 MG TABS;  Therapy: (Recorded:09Nov2015) to Recorded 6. Hydrochlorothiazide 25 MG Oral  Tablet;  Therapy: (Recorded:09Nov2015) to Recorded 7. Lovastatin 20 MG Oral Tablet;  Therapy: (Recorded:09Nov2015) to Recorded 8. Naproxen 500 MG Oral Tablet;  Therapy: (Recorded:09Nov2015) to Recorded 9. QC Ferrous Sulfate 325 (65 Fe) MG Oral Tablet;  Therapy: (Recorded:09Nov2015) to Recorded 10. Requip 1 MG Oral Tablet;   Therapy: (Recorded:09Nov2015) to Recorded 11. Viagra 50 MG Oral Tablet;   Therapy: (Recorded:09Nov2015) to Recorded  Allergies Medication  1. No Known Drug Allergies  Family History Problems  1. Family history of hypertension (Z82.49) : Father 2. Family history of prostate cancer (Z80.42)  Social History  Problems    Alcohol use (F10.99)   drinks alcohol socially   Current every day smoker (F17.200)  Alcohol use (V49.89) (F10.99)   - drinks alcohol socially    Current every day smoker (305.1) (F17.200)   Review of Systems Constitutional, skin, eye, otolaryngeal, hematologic/lymphatic, cardiovascular, pulmonary, endocrine, musculoskeletal, gastrointestinal, neurological and psychiatric system(s) were reviewed and pertinent findings if present are noted.    Vitals  Height: 5 ft 9 in Weight: 190 lb  BMI Calculated: 28.06 BSA Calculated: 2.02   Physical Exam Constitutional: Well nourished and well developed . No acute distress.  ENT:. The ears and nose are normal in appearance.  Neck: The appearance of the neck is normal and no neck mass is present.  Pulmonary: No respiratory distress, normal respiratory rhythm and effort and clear bilateral breath sounds.  Cardiovascular: Heart rate and rhythm are normal . No peripheral edema.  Abdomen: The abdomen is soft and nontender. No masses are palpated. No CVA tenderness. No hernias are  palpable. No hepatosplenomegaly noted.    Discussion/Summary 1. Low risk prostate cancer:  He will undergo a prostate biopsy per protocol on active surveillance.

## 2014-09-11 ENCOUNTER — Ambulatory Visit (HOSPITAL_COMMUNITY): Payer: Medicare Other | Admitting: Anesthesiology

## 2014-09-11 ENCOUNTER — Encounter (HOSPITAL_COMMUNITY)
Admission: RE | Disposition: A | Discharge status: Home / Self Care | Payer: Self-pay | Source: Ambulatory Visit | Attending: Urology

## 2014-09-11 ENCOUNTER — Encounter (HOSPITAL_COMMUNITY): Payer: Self-pay | Admitting: *Deleted

## 2014-09-11 ENCOUNTER — Ambulatory Visit (HOSPITAL_COMMUNITY)
Admission: RE | Admit: 2014-09-11 | Discharge: 2014-09-11 | Disposition: A | Discharge status: Home / Self Care | Payer: Medicare Other | Source: Ambulatory Visit | Attending: Urology | Admitting: Urology

## 2014-09-11 DIAGNOSIS — Z7982 Long term (current) use of aspirin: Secondary | ICD-10-CM | POA: Diagnosis not present

## 2014-09-11 DIAGNOSIS — I10 Essential (primary) hypertension: Secondary | ICD-10-CM | POA: Insufficient documentation

## 2014-09-11 DIAGNOSIS — E785 Hyperlipidemia, unspecified: Secondary | ICD-10-CM | POA: Diagnosis not present

## 2014-09-11 DIAGNOSIS — B1920 Unspecified viral hepatitis C without hepatic coma: Secondary | ICD-10-CM | POA: Diagnosis not present

## 2014-09-11 DIAGNOSIS — C61 Malignant neoplasm of prostate: Secondary | ICD-10-CM | POA: Insufficient documentation

## 2014-09-11 DIAGNOSIS — G2581 Restless legs syndrome: Secondary | ICD-10-CM | POA: Insufficient documentation

## 2014-09-11 DIAGNOSIS — F17210 Nicotine dependence, cigarettes, uncomplicated: Secondary | ICD-10-CM | POA: Insufficient documentation

## 2014-09-11 DIAGNOSIS — E119 Type 2 diabetes mellitus without complications: Secondary | ICD-10-CM | POA: Insufficient documentation

## 2014-09-11 HISTORY — PX: PROSTATE BIOPSY: SHX241

## 2014-09-11 SURGERY — BIOPSY, PROSTATE, RECTAL APPROACH, WITH US GUIDANCE
Anesthesia: Monitor Anesthesia Care

## 2014-09-11 MED ORDER — LIDOCAINE HCL 2 % IJ SOLN
INTRAMUSCULAR | Status: AC
  Filled 2014-09-11: qty 20

## 2014-09-11 MED ORDER — HYDROMORPHONE HCL 1 MG/ML IJ SOLN: 0.2500 mg | INTRAMUSCULAR | Status: DC | PRN

## 2014-09-11 MED ORDER — ONDANSETRON HCL 4 MG/2ML IJ SOLN
INTRAMUSCULAR | Status: DC | PRN
  Administered 2014-09-11: 4 mg via INTRAVENOUS

## 2014-09-11 MED ORDER — OXYCODONE HCL 5 MG/5ML PO SOLN: 5.0000 mg | Freq: Once | ORAL | Status: DC | PRN

## 2014-09-11 MED ORDER — PROMETHAZINE HCL 25 MG/ML IJ SOLN: 6.2500 mg | INTRAMUSCULAR | Status: DC | PRN

## 2014-09-11 MED ORDER — FENTANYL CITRATE 0.05 MG/ML IJ SOLN
INTRAMUSCULAR | Status: DC | PRN
  Administered 2014-09-11 (×2): 50 ug via INTRAVENOUS

## 2014-09-11 MED ORDER — PROPOFOL INFUSION 10 MG/ML OPTIME
INTRAVENOUS | Status: DC | PRN
  Administered 2014-09-11: 140 ug/kg/min via INTRAVENOUS

## 2014-09-11 MED ORDER — FENTANYL CITRATE 0.05 MG/ML IJ SOLN
INTRAMUSCULAR | Status: AC
  Filled 2014-09-11: qty 2

## 2014-09-11 MED ORDER — LACTATED RINGERS IV SOLN: INTRAVENOUS | Status: DC | PRN

## 2014-09-11 MED ORDER — MIDAZOLAM HCL 5 MG/5ML IJ SOLN
INTRAMUSCULAR | Status: DC | PRN
  Administered 2014-09-11: 2 mg via INTRAVENOUS

## 2014-09-11 MED ORDER — SODIUM CHLORIDE 0.9 % IV SOLN
INTRAVENOUS | Status: DC
  Administered 2014-09-11: 15:00:00 via INTRAVENOUS

## 2014-09-11 MED ORDER — PROPOFOL 10 MG/ML IV BOLUS
INTRAVENOUS | Status: DC | PRN
  Administered 2014-09-11: 80 mg via INTRAVENOUS

## 2014-09-11 MED ORDER — PROPOFOL 10 MG/ML IV BOLUS
INTRAVENOUS | Status: AC
  Filled 2014-09-11: qty 20

## 2014-09-11 MED ORDER — LIDOCAINE HCL 2 % IJ SOLN
INTRAMUSCULAR | Status: DC | PRN
  Administered 2014-09-11: 10 mL

## 2014-09-11 MED ORDER — MIDAZOLAM HCL 2 MG/2ML IJ SOLN
INTRAMUSCULAR | Status: AC
  Filled 2014-09-11: qty 2

## 2014-09-11 MED ORDER — DEXTROSE 50 % IV SOLN
INTRAVENOUS | Status: AC
  Filled 2014-09-11: qty 50

## 2014-09-11 MED ORDER — ONDANSETRON HCL 4 MG/2ML IJ SOLN
INTRAMUSCULAR | Status: AC
  Filled 2014-09-11: qty 2

## 2014-09-11 MED ORDER — OXYCODONE HCL 5 MG PO TABS: 5.0000 mg | ORAL_TABLET | Freq: Once | ORAL | Status: DC | PRN

## 2014-09-11 MED ORDER — DEXTROSE 50 % IV SOLN
25.0000 mL | Freq: Once | INTRAVENOUS | Status: AC
  Administered 2014-09-11: 25 mL via INTRAVENOUS

## 2014-09-11 SURGICAL SUPPLY — 3 items
NEEDLE SPNL 22GX7 QUINCKE BK (NEEDLE) ×2 IMPLANT
SYR CONTROL 10ML LL (SYRINGE) ×2 IMPLANT
UNDERPAD 30X30 INCONTINENT (UNDERPADS AND DIAPERS) ×2 IMPLANT

## 2014-09-11 NOTE — Progress Notes (Signed)
PACU note----CBG=80

## 2014-09-11 NOTE — Progress Notes (Signed)
CBG 68 at 1300. Patient is asymptomatic. He had 8 oz water at 12 noon. Dr. Emmaline Life informed.

## 2014-09-11 NOTE — Transfer of Care (Signed)
Immediate Anesthesia Transfer of Care Note  Patient: John Sloan  Procedure(s) Performed: Procedure(s) (LRB): BIOPSY TRANSRECTAL ULTRASONIC PROSTATE (TUBP) (N/A)  Patient Location: PACU  Anesthesia Type: MAC  Level of Consciousness: sedated, patient cooperative and responds to stimulation  Airway & Oxygen Therapy: Patient Spontanous Breathing and Patient connected to face mask oxgen  Post-op Assessment: Report given to PACU RN and Post -op Vital signs reviewed and stable  Post vital signs: Reviewed and stable  Complications: No apparent anesthesia complications

## 2014-09-11 NOTE — Op Note (Signed)
Preoperative diagnosis:  1. Prostate cancer  Postoperative diagnosis: 1. Prostate cancer  Procedure(s): 1. Transrectal ultrasound of the prostate 2.  Prostate needle biopsy  Surgeon: Dr. Roxy Horseman, Jr  Anesthesia: General  Complications: None  EBL: Minimal  Specimens:  1.  Right lateral base 2.  Right base 3.  Right lateral mid 4.  Right mid 5.  Right lateral apex 6.  Right apex 7.  Left lateral base 8.  Left base 9.  Left lateral mid 10.  Left mid 11.  Left lateral apex 12.  Left apex   Disposition of specimens: Pathology  Indication: John Sloan is a 59 year old gentleman with a history of low risk prostate cancer on active surveillance.  He presents today for a surveillance protocol biopsy.  Description of procedure:  The patient was taken to the operating room and IV sedation was administered.  He was placed in the left lateral decubitus position.  I then inserted the 10 MHz ultrasound probe into the rectum and visualized the prostate. There was a calcification located at the right mid medial prostate but it was otherwise homogenous without concerning lesions. Prostate volume was measured at 36.6 cc. I utilized 2% Xylocaine total of 10 cc injected to perform a periprostatic nerve block at the junction of the prostate and seminal vesicles.  I then obtained 12 spring-loaded core biopsies from the prostate under ultrasound guidance.  Biopsies were obtained from the above listed areas in a standard sextant fashion with care to include both the parasagittal and far lateral regions of the prostate.  The patient tolerated the procedure well and without complications.  He was able to be awakened and transferred to the recovery unit in satisfactory condition.

## 2014-09-11 NOTE — Progress Notes (Signed)
33ml of 50% Dextrose given at 1450. Report given to Maren Beach, RN.

## 2014-09-11 NOTE — Discharge Instructions (Signed)
You can expect some mild bleeding in the urine from the rectum.  This should resolve in the next few days.  You may also see some blood in the ejaculate fluid or semen intermittently for a few months.  You should notify Dr. Alinda Money if you develop fever > 101 degrees.  You should not take any aspirin or anti-inflammatory medications (this includes naproxen) for 72 hours.    Dr. Alinda Money will call with the biopsy results within the next 3-7 days.

## 2014-09-11 NOTE — Progress Notes (Signed)
On arrival to PACU, BP 79/50, pt unresponsive, resp even/unlabored; pt encourage to wake up, more alert, BP 106/68; pt responding appropriately

## 2014-09-11 NOTE — Anesthesia Preprocedure Evaluation (Addendum)
Anesthesia Evaluation  Patient identified by MRN, date of birth, ID band Patient awake    Reviewed: Allergy & Precautions, NPO status , Patient's Chart, lab work & pertinent test results  History of Anesthesia Complications Negative for: history of anesthetic complications  Airway Mallampati: II  TM Distance: >3 FB Neck ROM: Full    Dental  (+) Poor Dentition, Dental Advisory Given   Pulmonary Current Smoker,    Pulmonary exam normal       Cardiovascular hypertension,     Neuro/Psych negative neurological ROS  negative psych ROS   GI/Hepatic negative GI ROS, Neg liver ROS,   Endo/Other  diabetes  Renal/GU negative Renal ROS     Musculoskeletal   Abdominal   Peds  Hematology   Anesthesia Other Findings   Reproductive/Obstetrics                            Anesthesia Physical Anesthesia Plan  ASA: II  Anesthesia Plan: MAC   Post-op Pain Management:    Induction:   Airway Management Planned: Simple Face Mask  Additional Equipment:   Intra-op Plan:   Post-operative Plan:   Informed Consent: I have reviewed the patients History and Physical, chart, labs and discussed the procedure including the risks, benefits and alternatives for the proposed anesthesia with the patient or authorized representative who has indicated his/her understanding and acceptance.   Dental advisory given  Plan Discussed with: CRNA and Anesthesiologist  Anesthesia Plan Comments:        Anesthesia Quick Evaluation

## 2014-09-11 NOTE — Anesthesia Postprocedure Evaluation (Signed)
Anesthesia Post Note  Patient: John Sloan  Procedure(s) Performed: Procedure(s) (LRB): BIOPSY TRANSRECTAL ULTRASONIC PROSTATE (TUBP) (N/A)  Anesthesia type: MAC  Patient location: PACU  Post pain: Pain level controlled  Post assessment: Patient's Cardiovascular Status Stable  Last Vitals:  Filed Vitals:   09/11/14 1628  BP: 119/81  Pulse: 58  Temp: 36.3 C  Resp: 14    Post vital signs: Reviewed and stable  Level of consciousness: sedated  Complications: No apparent anesthesia complications

## 2014-09-12 ENCOUNTER — Encounter (HOSPITAL_COMMUNITY): Payer: Self-pay | Admitting: Urology

## 2014-09-12 LAB — GLUCOSE, CAPILLARY
GLUCOSE-CAPILLARY: 68 mg/dL — AB (ref 70–99)
Glucose-Capillary: 108 mg/dL — ABNORMAL HIGH (ref 70–99)
Glucose-Capillary: 80 mg/dL (ref 70–99)

## 2014-09-20 ENCOUNTER — Encounter: Payer: Self-pay | Admitting: Family Medicine

## 2014-09-26 ENCOUNTER — Encounter: Payer: Self-pay | Admitting: Family

## 2014-09-26 ENCOUNTER — Ambulatory Visit: Payer: Medicare Other | Admitting: Internal Medicine

## 2014-09-26 ENCOUNTER — Telehealth: Payer: Self-pay | Admitting: Family

## 2014-09-26 ENCOUNTER — Ambulatory Visit (INDEPENDENT_AMBULATORY_CARE_PROVIDER_SITE_OTHER): Payer: Medicare Other | Admitting: Family

## 2014-09-26 ENCOUNTER — Other Ambulatory Visit (INDEPENDENT_AMBULATORY_CARE_PROVIDER_SITE_OTHER): Payer: Medicare Other

## 2014-09-26 VITALS — BP 120/72 | HR 72 | Temp 98.0°F | Resp 18 | Ht 69.0 in | Wt 202.0 lb

## 2014-09-26 DIAGNOSIS — E119 Type 2 diabetes mellitus without complications: Secondary | ICD-10-CM

## 2014-09-26 DIAGNOSIS — I10 Essential (primary) hypertension: Secondary | ICD-10-CM

## 2014-09-26 DIAGNOSIS — R7303 Prediabetes: Secondary | ICD-10-CM | POA: Insufficient documentation

## 2014-09-26 DIAGNOSIS — C61 Malignant neoplasm of prostate: Secondary | ICD-10-CM

## 2014-09-26 LAB — MICROALBUMIN / CREATININE URINE RATIO
Creatinine,U: 107.6 mg/dL
Microalb Creat Ratio: 0.7 mg/g (ref 0.0–30.0)
Microalb, Ur: <0.7 mg/dL (ref 0.0–1.9)

## 2014-09-26 LAB — HEMOGLOBIN A1C: HEMOGLOBIN A1C: 5.9 % (ref 4.6–6.5)

## 2014-09-26 MED ORDER — BENAZEPRIL HCL 20 MG PO TABS: 20.0000 mg | ORAL_TABLET | Freq: Every day | ORAL | Status: DC

## 2014-09-26 MED ORDER — HYDROCHLOROTHIAZIDE 25 MG PO TABS: 25.0000 mg | ORAL_TABLET | Freq: Every day | ORAL | Status: DC

## 2014-09-26 MED ORDER — LOVASTATIN 40 MG PO TABS: 40.0000 mg | ORAL_TABLET | Freq: Every day | ORAL | Status: DC

## 2014-09-26 NOTE — Progress Notes (Signed)
Subjective:    Patient ID: John Sloan, male    DOB: [birth year], 59 y.o.   MRN: [id number]  Chief Complaint  Patient presents with  . Establish Care    would like to talk about prostate situation he has going on    HPI:  John Sloan is a 59 y.o. male who presents today to establish care with this provider and discuss a diagnosis of prostate cancer.   1) Prostate Cancer - This is a chronic problem that has worsened. Had a biopsy last week with Dr. Alinda Money and will be discussing potential surgery options. Does experience occasional frequency of urination.   2) Diabetes - Stable with current regimen of Amaryl.  Lab Results  Component Value Date   HGBA1C 5.9 03/09/2014    Lab Results  Component Value Date   CREATININE 1.05 09/05/2014    3) Hypertension - Stable with current regimen of amlodipine, benazepril and hydrochlorothiazide.  BP Readings from Last 3 Encounters:  09/26/14 120/72  09/11/14 137/88  04/27/14 126/79     No Known Allergies   Current Outpatient Prescriptions on File Prior to Visit  Medication Sig Dispense Refill  . amLODipine (NORVASC) 10 MG tablet Take 1 tablet (10 mg total) by mouth daily. 30 tablet 3  . aspirin 81 MG tablet Take 81 mg by mouth daily.    . benazepril (LOTENSIN) 20 MG tablet Take 1 tablet (20 mg total) by mouth daily. 30 tablet 6  . ferrous sulfate 325 (65 FE) MG tablet Take 325 mg by mouth daily with breakfast.     . glimepiride (AMARYL) 2 MG tablet Take 1 tablet (2 mg total) by mouth daily with breakfast. 90 tablet 4  . hydrochlorothiazide (HYDRODIURIL) 25 MG tablet Take 1 tablet (25 mg total) by mouth daily. 30 tablet 3  . lovastatin (MEVACOR) 40 MG tablet Take 1 tablet (40 mg total) by mouth at bedtime. 30 tablet 6  . rOPINIRole (REQUIP) 0.5 MG tablet 1 tab po qAM, 1 tab po q Mid-day, and 2-3 tabs po qhs 150 tablet 6   No current facility-administered medications on file prior to visit.    Past Medical History    Diagnosis Date  . HTN (hypertension)   . Hyperlipemia   . Erectile dysfunction   . Restless legs syndrome   . Osteoarthritis     Left knee, right hand, left shoulder--fluctuates with weather.   . History of adenomatous polyp of colon   . Fatty liver     bx done per pt  . History of hepatitis C virus infection     treated 2014--cured per pt (s/p interferon)  . Tobacco dependence   . DM (diabetes mellitus)     "borderline" per pt report--was put on glimeperide beginning of 2015 [place]  . Prostate cancer 02/2012    Gleason 3+3=6: "low volume, low risk --active surveillance per Urol [place] of state.  PSA 3.7 12/28/13, down slightly from 4.0 on 09/16/13.  Repeat PSA  04/27/14 was 4.66.  On initial visit with new local urologist, Dr. Alinda Money, PSA was 6.19-repeat bx as per protocol was done 09/11/14 and only 2 of 12 cores were pos for prostate adenocarcinoma (gleason score on these 2 was same as initial bx)     Review of Systems  Constitutional: Negative for fever and chills.  Respiratory: Negative for chest tightness and shortness of breath.   Cardiovascular: Negative for chest pain, palpitations and leg swelling.  Genitourinary: Negative for  hematuria and difficulty urinating.      Objective:    BP 120/72 mmHg  Pulse 72  Temp(Src) 98 F (36.7 C) (Oral)  Resp 18  Ht 5\' 9"  (1.753 m)  Wt 202 lb (91.627 kg)  BMI 29.82 kg/m2  SpO2 97% Nursing note and vital signs reviewed.  Physical Exam  Constitutional: He is oriented to person, place, and time. He appears well-developed and well-nourished. No distress.  Cardiovascular: Normal rate, regular rhythm, normal heart sounds and intact distal pulses.   Pulmonary/Chest: Effort normal and breath sounds normal.  Musculoskeletal:  Diabetic foot exam - bilateral feet are free from skin breakdown and abrasions. Pulses and sensation are intact and appropriate. Sensation is intact to both vibration and monofilament throughout.   Neurological: He is alert and oriented to person, place, and time.  Skin: Skin is warm and dry.  Psychiatric: He has a normal mood and affect. His behavior is normal. Judgment and thought content normal.       Assessment & Plan:

## 2014-09-26 NOTE — Assessment & Plan Note (Signed)
Stable at present and management per Dr. Alinda Money of urology.

## 2014-09-26 NOTE — Assessment & Plan Note (Signed)
Hypertension is well controlled with current regimen and below goal of 140/90. Continue amlodipine, benazepril and hydrochlorothiazide at current dosages.

## 2014-09-26 NOTE — Telephone Encounter (Signed)
Please inform the patient has A1c was 5.9. As per our discussions please stop taking the glimepiride at this time. Also his kidney function was normal. Plan to follow-up in 3-6 months.

## 2014-09-26 NOTE — Progress Notes (Signed)
Pre visit review using our clinic review tool, if applicable. No additional management support is needed unless otherwise documented below in the visit note. 

## 2014-09-26 NOTE — Assessment & Plan Note (Signed)
Diabetes is stable with current regimen of Amaryl. Obtain A1c and urine microalbumin. Diabetic foot exam completed and normal. Patient referred to ophthalmology for but diabetic eye exam. If A1c remains low consider this medication holiday from Amaryl.

## 2014-09-26 NOTE — Patient Instructions (Signed)
Thank you for choosing [facility].  Summary/Instructions:  Your prescription(s) have been submitted to your pharmacy or been printed and provided for you. Please take as directed and contact our office if you believe you are having problem(s) with the medication(s) or have any questions.  Please stop by the lab on the basement level of the building for your blood work. Your results will be released to MyChart (or called to you) after review, usually within 72 hours after test completion. If any changes need to be made, you will be notified at that same time.  If your symptoms worsen or fail to improve, please contact our office for further instruction, or in case of emergency go directly to the emergency room at the closest medical facility.     

## 2014-09-27 NOTE — Telephone Encounter (Signed)
Pt is aware of results. 

## 2014-10-27 ENCOUNTER — Ambulatory Visit: Payer: Medicare Other | Admitting: Family Medicine

## 2014-11-10 ENCOUNTER — Other Ambulatory Visit (HOSPITAL_COMMUNITY): Payer: Self-pay | Admitting: Oncology

## 2014-11-10 DIAGNOSIS — C61 Malignant neoplasm of prostate: Secondary | ICD-10-CM

## 2014-11-10 DIAGNOSIS — Z006 Encounter for examination for normal comparison and control in clinical research program: Secondary | ICD-10-CM | POA: Diagnosis not present

## 2014-11-28 ENCOUNTER — Encounter (HOSPITAL_COMMUNITY)
Admission: RE | Admit: 2014-11-28 | Discharge: 2014-11-28 | Disposition: A | Discharge status: Home / Self Care | Payer: Medicare Other | Source: Ambulatory Visit | Attending: *Deleted | Admitting: *Deleted

## 2014-11-28 ENCOUNTER — Encounter (HOSPITAL_COMMUNITY)
Admission: RE | Admit: 2014-11-28 | Discharge: 2014-11-28 | Disposition: A | Discharge status: Home / Self Care | Payer: Medicare Other | Source: Ambulatory Visit | Attending: Family | Admitting: Family

## 2014-11-28 ENCOUNTER — Ambulatory Visit (HOSPITAL_COMMUNITY): Admission: RE | Admit: 2014-11-28 | Payer: Medicare Other | Source: Ambulatory Visit

## 2014-11-28 DIAGNOSIS — C61 Malignant neoplasm of prostate: Secondary | ICD-10-CM

## 2014-11-28 DIAGNOSIS — Z8542 Personal history of malignant neoplasm of other parts of uterus: Secondary | ICD-10-CM | POA: Diagnosis not present

## 2014-11-28 DIAGNOSIS — Z8546 Personal history of malignant neoplasm of prostate: Secondary | ICD-10-CM | POA: Diagnosis not present

## 2014-11-28 MED ORDER — TECHNETIUM TC 99M MEDRONATE IV KIT
25.0000 | PACK | Freq: Once | INTRAVENOUS | Status: AC | PRN
  Administered 2014-11-28: 25 via INTRAVENOUS

## 2014-12-05 DIAGNOSIS — C61 Malignant neoplasm of prostate: Secondary | ICD-10-CM | POA: Diagnosis not present

## 2014-12-07 ENCOUNTER — Encounter: Payer: Self-pay | Admitting: Family Medicine

## 2014-12-11 ENCOUNTER — Encounter: Payer: Self-pay | Admitting: Radiation Oncology

## 2014-12-11 NOTE — Progress Notes (Addendum)
GU Location of Tumor / Histology: Adenocarcinoma of the Prostate  Prostate Biopsy- 09/11/14  Prostate, needle Biopsy, Left Base Lateral - Prostatic Adenocarcinoma, Gleason Score 3+4 Involving 50% of One Core  -  Prostate, Needle Biopsy, left Base Medial -Prostatic Adenocarcinoma, Gleason Score 3=4, Involving 10% of One Core  -Prostate Needle Biopsy, Left Mid Lateral Prostatic Adenocarcinoma, Gleason Score 3+3=6 Involving 30%  Of one Core  - Prostate needle Biopsy, Left Mid Medial Prostatic Adenocarcinoma, Gleason Score 3+4=7, Invloving 30% of One Core  Prostate Volume:31.1 cc  If Prostate Cancer, Gleason Score is (3 + 4) and PSA is (7)  John Sloan was initially diagnosed with low risk prostate cancer in [place], [place] in July 2013.  He underwent a prostate needle biopsy on 02/18/12 for an elevated PSA of 4.6 by Dr. Robert Bellow Delaurentis and was found to have 2 out of 18 biopsy cores positive for Gleason 3+3=6 adenocarcinoma of the prostate.  He chose active surveillance and has been undergoing PSA evaluations every 3 months or so without significant change in PSA values from baseline.    Seen in December 2015 by Dr. Raynelle Bring and as as result was reevaluated with a Prostate Biopsy to determine if there was progression. His PSA was 6.19.  Biopsy of the Prostate on 09/11/14 confirmed upgraded multifocal Gleason 3+4=7 adenocarcinoma.  He was initially non-compliant with follow up after his last biopsy.     Past/Anticipated interventions by urology, if any: [name]: Biopsy of Prostate  Past/Anticipated interventions by medical oncology, if XBJ:YNWG  Weight changes, if any: stable   Bowel/Bladder complaints, if any:I-PSS-14, I-PSS 12/12/14=12, no hematuria, no dysuria, no freq,or urgency ,good stream, regular bowel movements  Nausea/Vomiting, if any: NO  Pain issues, if any:  The other night felt like 1 side of his scrotum was being squeezed, resolved none since  SAFETY  ISSUES:  Prior radiation? No  Pacemaker/ICD? No  Possible current pregnancy? N/A  Is the patient on methotrexate? No  Current Complaints / other details:  Married, 5 children, no family hx cancer   Hepatitis C which has been treated with Interferon previously BP 131/89 mmHg  Pulse 59  Temp(Src) 97.7 F (36.5 C) (Oral)  Resp 20  Ht 5\' 9"  (1.753 m)  Wt 197 lb 4.8 oz (89.495 kg)  BMI 29.12 kg/m2  Wt Readings from Last 3 Encounters:  09/26/14 202 lb (91.627 kg)  09/11/14 197 lb 6 oz (89.529 kg)  04/27/14 195 lb (88.451 kg)   7:46 AM

## 2014-12-12 ENCOUNTER — Ambulatory Visit
Admission: RE | Admit: 2014-12-12 | Discharge: 2014-12-12 | Disposition: A | Discharge status: Home / Self Care | Payer: Medicare Other | Source: Ambulatory Visit | Attending: Radiation Oncology | Admitting: Radiation Oncology

## 2014-12-12 ENCOUNTER — Encounter: Payer: Self-pay | Admitting: Radiation Oncology

## 2014-12-12 VITALS — BP 131/89 | HR 59 | Temp 97.7°F | Resp 20 | Ht 69.0 in | Wt 197.3 lb

## 2014-12-12 DIAGNOSIS — C61 Malignant neoplasm of prostate: Secondary | ICD-10-CM | POA: Insufficient documentation

## 2014-12-12 NOTE — Addendum Note (Signed)
Encounter addended by: Doreen Beam, RN on: 12/12/2014 12:52 PM<BR>     Documentation filed: Medications

## 2014-12-12 NOTE — Progress Notes (Signed)
Please see the Nurse Progress Note in the MD Initial Consult Encounter for this patient. 

## 2014-12-12 NOTE — Progress Notes (Signed)
[facility] NEW PATIENT EVALUATION  Name: John Sloan MRN: [id number]  Date:   12/12/2014           DOB: Mar 31, 1956  Status: outpatient   CC: Mauricio Po, FNP  Raynelle Bring, MD    REFERRING PHYSICIAN: Raynelle Bring, MD   DIAGNOSIS: Clinical stage TIc intermediate risk adenocarcinoma prostate   HISTORY OF PRESENT ILLNESS:  John Sloan is a 59 y.o. male who is seen today through the courtesy of Dr. Dutch Gray for discussion of radiation therapy in the management of his stage TIc intermediate risk adenocarcinoma prostate.  He was diagnosed with favorable risk prostate cancer in [place], [place] in July 2013.  A prostate needle biopsy on 02/18/2012, for an elevated PSA of 4.6, was found to have 2 of 18 biopsy cores positive for Gleason 6 adenocarcinoma prostate.  He chose active surveillance.  He presented to Dr. Alinda Money in December 2015.  A PSA was 6.19 and his digital rectal examination was unremarkable.  A prostate biopsy on 09/11/2014 revealed Gleason 7 (3+4) involving 50% of one core from the left lateral base, 10% of one core from the left medial base, and 30% of one core from the left medial mid gland.  He had Gleason 6 (3+3) involving 30% of one core from the left lateral mid gland.  His gland volume was 36.6 mL.  He has a history of urinary obstructive symptomatology and has been on tamsulosin since his diagnosis in 2013.  His I PSS score today is 12.  He does report erectile dysfunction and has taken Viagra.  He also has a history of hemorrhoids.  He does have a history of hepatitis C which has been treated with interferon previously.  PREVIOUS RADIATION THERAPY: No   PAST MEDICAL HISTORY:  has a past medical history of HTN (hypertension); Hyperlipemia; Erectile dysfunction; Restless legs syndrome; Osteoarthritis; History of adenomatous polyp of colon; Fatty liver; History of hepatitis C virus infection; Tobacco dependence; DM  (diabetes mellitus); and Prostate cancer (02/2012).     PAST SURGICAL HISTORY:  Past Surgical History  Procedure Laterality Date  . Colonoscopy w/ polypectomy  approx 2012    1 polyp : recall 5 yrs  . Circumcision    . Prostate biopsy  02/18/2012  . Prostate biopsy N/A 09/11/2014    Disease upgraded s/p bx: pt considering treatments (both in [place] and at his former urologist's in [place].  Procedure: BIOPSY TRANSRECTAL ULTRASONIC PROSTATE (TUBP);  Surgeon: Raynelle Bring, MD;  Location: [facility];  Service: Urology;  Laterality: N/A;     FAMILY HISTORY: family history includes Hypertension in his father and mother; Prostate cancer in an other family member.  His father's health is unknown.  His mother died from a heart attack at 27.    SOCIAL HISTORY:  reports that he has been smoking Cigarettes.  He has a 20 pack-year smoking history. He has never used smokeless tobacco. He reports that he drinks alcohol. He reports that he does not use illicit drugs. Married, used to work as a Therapist, occupational.  Currently on disability secondary to hypertension and arthritis.   ALLERGIES: Review of patient's allergies indicates no known allergies.   MEDICATIONS:  Current Outpatient Prescriptions  Medication Sig Dispense Refill  . amLODipine (NORVASC) 10 MG tablet Take 1 tablet (10 mg total) by mouth daily. 30 tablet 3  . aspirin 81 MG tablet Take 81 mg by mouth daily.    . benazepril (LOTENSIN) 20 MG tablet Take  1 tablet (20 mg total) by mouth daily. 30 tablet 11  . ferrous sulfate 325 (65 FE) MG tablet Take 325 mg by mouth daily with breakfast.     . hydrochlorothiazide (HYDRODIURIL) 25 MG tablet Take 1 tablet (25 mg total) by mouth daily. 90 tablet 3  . lovastatin (MEVACOR) 40 MG tablet Take 1 tablet (40 mg total) by mouth at bedtime. 30 tablet 6  . naproxen (NAPROSYN) 500 MG tablet Take 500 mg by mouth as needed.    Marland Kitchen rOPINIRole (REQUIP) 0.5 MG tablet 1 tab po qAM, 1 tab po q Mid-day, and 2-3 tabs  po qhs 150 tablet 6  . cyclobenzaprine (FLEXERIL) 5 MG tablet Take 5 mg by mouth 3 (three) times daily as needed for muscle spasms.    Marland Kitchen glimepiride (AMARYL) 2 MG tablet Take 1 tablet (2 mg total) by mouth daily with breakfast. (Patient not taking: Reported on 12/12/2014) 90 tablet 4  . sildenafil (VIAGRA) 50 MG tablet Take 50 mg by mouth daily as needed for erectile dysfunction.     No current facility-administered medications for this encounter.     REVIEW OF SYSTEMS:  Pertinent items are noted in HPI.    PHYSICAL EXAM:  height is 5\' 9"  (1.753 m) and weight is 197 lb 4.8 oz (89.495 kg). His oral temperature is 97.7 F (36.5 C). His blood pressure is 131/89 and his pulse is 59. His respiration is 20.   Head and neck examination: Grossly unremarkable.  Nodes: Without palpable cervical or supraclavicular lymphadenopathy.  Chest: Lungs clear.  Abdomen: Without masses organomegaly.  Genitalia: Unremarkable to inspection.  Rectal: He does have internal hemorrhoids.  Prostate gland is normal in size and is without focal induration or nodularity.  Extremities: Without edema.   LABORATORY DATA:  Lab Results  Component Value Date   WBC 7.5 09/05/2014   HGB 15.6 09/05/2014   HCT 45.8 09/05/2014   MCV 86.3 09/05/2014   PLT 303 09/05/2014   Lab Results  Component Value Date   NA 137 09/05/2014   K 3.4* 09/05/2014   CL 100 09/05/2014   CO2 30 09/05/2014   Lab Results  Component Value Date   ALT 35 03/07/2014   AST 34 03/07/2014   ALKPHOS 42 03/07/2014   BILITOT 0.9 03/07/2014   PSA 6.19 from 07/11/2014   IMPRESSION: Stage TIc intermediate risk adenocarcinoma prostate.  I explained to the patient and his wife that his prognosis is related to his stage, PSA level, and Gleason score.  His stage and PSA level are favorable while his Gleason score of 7 is of intermediate favorability.  We discussed surgery versus radiation therapy.  Radiation therapy options include 5 weeks of external beam  followed by seed implantation or 8 weeks of external beam/IMRT.  Considering his obstructive symptoms on tamsulosin, I do not feel that he would be an ideal candidate for a seed implant boost.  We discussed the potential acute and late toxicities of external beam/IMRT.  We also discussed bladder filling.  He will need to have 3 gold seed markers placed for image guidance.  He is young, and told him that he would be a good candidate for surgery.  If he declines surgery then I would offer him external beam/IMRT.  He'll go home and think things over and get back in touch with me if he wants to consider radiation therapy.   PLAN: As discussed above.  I spent 45 minutes face to face with the patient and  more than 50% of that time was spent in counseling and/or coordination of care.

## 2014-12-13 DIAGNOSIS — E119 Type 2 diabetes mellitus without complications: Secondary | ICD-10-CM | POA: Diagnosis not present

## 2014-12-16 ENCOUNTER — Encounter: Payer: Self-pay | Admitting: Radiation Oncology

## 2014-12-16 NOTE — Progress Notes (Signed)
Chart note: John Sloan called me to tell me that he wants proceed with 8 weeks of external beam/IMRT.  He does not want to consider surgery.  I told him that we would need to have Dr. Alinda Money place 3 gold seed markers for image guidance.  I will schedule this through his office.  Once this is done I can get him scheduled for CT simulation.  We also discussed bladder filling her prior to his CT simulation and daily treatments.

## 2014-12-18 ENCOUNTER — Telehealth: Payer: Self-pay | Admitting: *Deleted

## 2014-12-18 NOTE — Telephone Encounter (Signed)
Called patient to inform of gold seed placement for 01-11-15 @ 2:45 pm @ Dr. Lynne Logan Office and his sim on 01-22-15 @ 9 am @ Dr. Charlton Amor Office, spoke with patient and he is aware of these appts.

## 2014-12-22 ENCOUNTER — Ambulatory Visit: Payer: Medicare Other | Admitting: Radiation Oncology

## 2014-12-25 ENCOUNTER — Ambulatory Visit: Payer: Medicare Other | Admitting: Family

## 2014-12-26 ENCOUNTER — Ambulatory Visit (INDEPENDENT_AMBULATORY_CARE_PROVIDER_SITE_OTHER): Payer: Medicare Other | Admitting: Family

## 2014-12-26 ENCOUNTER — Other Ambulatory Visit (INDEPENDENT_AMBULATORY_CARE_PROVIDER_SITE_OTHER): Payer: Medicare Other

## 2014-12-26 ENCOUNTER — Telehealth: Payer: Self-pay | Admitting: Family

## 2014-12-26 ENCOUNTER — Encounter: Payer: Self-pay | Admitting: Family

## 2014-12-26 VITALS — BP 112/78 | HR 53 | Temp 98.0°F | Resp 18 | Ht 69.0 in | Wt 198.8 lb

## 2014-12-26 DIAGNOSIS — I10 Essential (primary) hypertension: Secondary | ICD-10-CM | POA: Diagnosis not present

## 2014-12-26 DIAGNOSIS — G479 Sleep disorder, unspecified: Secondary | ICD-10-CM | POA: Diagnosis not present

## 2014-12-26 DIAGNOSIS — E119 Type 2 diabetes mellitus without complications: Secondary | ICD-10-CM | POA: Diagnosis not present

## 2014-12-26 LAB — HEMOGLOBIN A1C: HEMOGLOBIN A1C: 6.1 % (ref 4.6–6.5)

## 2014-12-26 MED ORDER — AMLODIPINE BESYLATE 10 MG PO TABS: 10.0000 mg | ORAL_TABLET | Freq: Every day | ORAL | Status: DC

## 2014-12-26 MED ORDER — TEMAZEPAM 15 MG PO CAPS: 15.0000 mg | ORAL_CAPSULE | Freq: Every evening | ORAL | Status: DC | PRN

## 2014-12-26 NOTE — Progress Notes (Signed)
Pre visit review using our clinic review tool, if applicable. No additional management support is needed unless otherwise documented below in the visit note. 

## 2014-12-26 NOTE — Assessment & Plan Note (Signed)
Previously discontinued glimepiride with an A1c of 5.9. Obtain A1c to recheck today.

## 2014-12-26 NOTE — Patient Instructions (Addendum)
Thank you for choosing [facility].  Summary/Instructions:  Your prescription(s) have been submitted to your pharmacy or been printed and provided for you. Please take as directed and contact our office if you believe you are having problem(s) with the medication(s) or have any questions.  Please stop by the lab on the basement level of the building for your blood work. Your results will be released to [place] (or called to you) after review, usually within 72 hours after test completion. If any changes need to be made, you will be notified at that same time.  If your symptoms worsen or fail to improve, please contact our office for further instruction, or in case of emergency go directly to the emergency room at the closest medical facility.   Recommendations for improving sleep:   Avoid having pets sleep in the bedroom  Avoid caffeine consumption after 4pm  Keep bedroom cool and conducive to sleep  Avoid nicotine use, especially in the evening  Avoid exercise within 2-3 hours before bedtime  Stimulus Control:   Go to bed only when sleepy  Use the bedroom for sleep and sex only  Go to another room if you are unable to fall asleep within 15 to 20 minutes  Read or engage in other quiet activities and return to bed only when sleepy.

## 2014-12-26 NOTE — Assessment & Plan Note (Signed)
Hypertension remains well controlled with current regimen below goal of 140/90. Continue current dosages of amlodipine and metoprolol.

## 2014-12-26 NOTE — Assessment & Plan Note (Signed)
Discussed difficulty falling asleep 2 months with previous history of restless leg syndrome. Discussed importance of sleep hygiene and information provided in after visit summary. Recommend starting melatonin is available over-the-counter. Start temazepam if melatonin is unsuccessful. Follow up in 1 month to determine effectiveness.

## 2014-12-26 NOTE — Progress Notes (Signed)
Subjective:    Patient ID: John Sloan, male    DOB: 01/20/1956, 59 y.o.   MRN: [id number]  Chief Complaint  Patient presents with  . Follow-up    BP says that his pressure has been looking better since being on the BP medication, Mentioned that he is having trouble sleeping at night    HPI:  John Sloan is a 59 y.o. male with a PMH of hypertension, type 2 diabetes, and prostate cancer who presents today for an office follow-up.   1.) Blood pressure - Maintained on HCTZ and amlodipine. Takes his medications as prescribed and denies any adverse effects.   BP Readings from Last 3 Encounters:  12/26/14 112/78  09/26/14 120/72  09/11/14 137/88    2.) Sleep disturbance - Associated symptom of sleep disturbance and not being able to fall asleep has been going on for a couple of months. Indicates that he goes to bed around 1am and averages about 4-5 hours of sleep per night. There are no modifying factors that make it better or worse. When he attempts to sleep he feels as though he is wide awake and notes that his legs will jump on occasion. Does have history of restless leg syndrome and treated with Requip.   3.) Type 2 Diabetes - previously noted to have an A1c of 5.9. Discontinue his glimepiride. Requesting follow-up on A1c.  Lab Results  Component Value Date   HGBA1C 5.9 09/26/2014    No Known Allergies  Current Outpatient Prescriptions on File Prior to Visit  Medication Sig Dispense Refill  . amLODipine (NORVASC) 10 MG tablet Take 1 tablet (10 mg total) by mouth daily. 30 tablet 3  . aspirin 81 MG tablet Take 81 mg by mouth daily.    . benazepril (LOTENSIN) 20 MG tablet Take 1 tablet (20 mg total) by mouth daily. 30 tablet 11  . cyclobenzaprine (FLEXERIL) 5 MG tablet Take 5 mg by mouth 3 (three) times daily as needed for muscle spasms.    . ferrous sulfate 325 (65 FE) MG tablet Take 325 mg by mouth daily with breakfast.     . glimepiride (AMARYL) 2 MG tablet Take 1  tablet (2 mg total) by mouth daily with breakfast. (Patient not taking: Reported on 12/12/2014) 90 tablet 4  . hydrochlorothiazide (HYDRODIURIL) 25 MG tablet Take 1 tablet (25 mg total) by mouth daily. 90 tablet 3  . lovastatin (MEVACOR) 40 MG tablet Take 1 tablet (40 mg total) by mouth at bedtime. 30 tablet 6  . naproxen (NAPROSYN) 500 MG tablet Take 500 mg by mouth as needed.    Marland Kitchen rOPINIRole (REQUIP) 0.5 MG tablet 1 tab po qAM, 1 tab po q Mid-day, and 2-3 tabs po qhs 150 tablet 6  . sildenafil (VIAGRA) 50 MG tablet Take 50 mg by mouth daily as needed for erectile dysfunction.    . tamsulosin (FLOMAX) 0.4 MG CAPS capsule Take 0.4 mg by mouth daily after supper.     No current facility-administered medications on file prior to visit.     Review of Systems  Eyes:       Negative for changes in vision.   Respiratory: Negative for chest tightness and shortness of breath.   Cardiovascular: Negative for chest pain, palpitations and leg swelling.      Objective:    BP 112/78 mmHg  Pulse 53  Temp(Src) 98 F (36.7 C) (Oral)  Resp 18  Ht 5\' 9"  (1.753 m)  Wt 198 lb 12.8 oz (90.175  kg)  BMI 29.34 kg/m2  SpO2 96% Nursing note and vital signs reviewed.  Physical Exam  Constitutional: He is oriented to person, place, and time. He appears well-developed and well-nourished. No distress.  Cardiovascular: Normal rate, regular rhythm, normal heart sounds and intact distal pulses.   Pulmonary/Chest: Effort normal and breath sounds normal.  Neurological: He is alert and oriented to person, place, and time.  Skin: Skin is warm and dry.  Psychiatric: He has a normal mood and affect. His behavior is normal. Judgment and thought content normal.       Assessment & Plan:

## 2014-12-26 NOTE — Telephone Encounter (Signed)
Please inform patient that his A1c is 6.1 indicating that he can continue without Amaryl. We can plan to follow up in 1 month for sleep.

## 2014-12-27 NOTE — Telephone Encounter (Signed)
Pt aware of results 

## 2015-01-11 ENCOUNTER — Other Ambulatory Visit: Payer: Self-pay

## 2015-01-11 DIAGNOSIS — C61 Malignant neoplasm of prostate: Secondary | ICD-10-CM | POA: Diagnosis not present

## 2015-01-11 MED ORDER — BENAZEPRIL HCL 20 MG PO TABS: 20.0000 mg | ORAL_TABLET | Freq: Every day | ORAL | Status: DC

## 2015-01-11 MED ORDER — LOVASTATIN 40 MG PO TABS: 40.0000 mg | ORAL_TABLET | Freq: Every day | ORAL | Status: DC

## 2015-01-22 ENCOUNTER — Ambulatory Visit
Admission: RE | Admit: 2015-01-22 | Discharge: 2015-01-22 | Disposition: A | Discharge status: Home / Self Care | Payer: Medicare Other | Source: Ambulatory Visit | Attending: Radiation Oncology | Admitting: Radiation Oncology

## 2015-01-22 DIAGNOSIS — C61 Malignant neoplasm of prostate: Secondary | ICD-10-CM | POA: Diagnosis not present

## 2015-01-22 NOTE — Progress Notes (Signed)
Complex simulation/treatment planning note: The patient was taken to the CT simulator.  After consent was signed,  he was placed supine on the CT simulator table.  A custom Vac lock immobilization device was constructed.  A red rubber tube was placed within the rectal vault.  He was then catheterized and contrast instilled into the bladder/urethra.  He was then scanned .  I chose an isocenter along the central prostate.  The CT data set was sent to the [facility]  Planning system where I contoured his prostate, seminal vesicles, bladder, rectum, and lower rectosigmoid colon. He is now ready for IMRT simulation/treatment planning.  I am prescribing 7800 cGy to his prostate PTV which represents the prostate +0.8 cm except for 0.5 cm along the rectum.  I prescribing 5600 cGy in 40 sessions to his seminal vesicles which includes the seminal vesicles +0.5 cm.

## 2015-01-24 ENCOUNTER — Encounter: Payer: Self-pay | Admitting: Radiation Oncology

## 2015-01-24 DIAGNOSIS — C61 Malignant neoplasm of prostate: Secondary | ICD-10-CM | POA: Diagnosis not present

## 2015-01-24 NOTE — Progress Notes (Signed)
IMRT simulation/treatment planning note: The patient completed his IMRT simulation/treatment planning in the management of his carcinoma the prostate.  IMRT was chosen to decrease the risk for both acute and late bladder and rectal toxicity compared to conventional or 3-D conformal radiation therapy.  Dose volume histograms were obtained for the avoidance structures including the bladder, rectum, and femoral heads.  We met our departmental guidelines.  Dose volume histograms were obtained for the target structures including the prostate PTV and seminal vesicle PTV and we met our departmental guidelines.  He is being treated with dual ARC VMAT IMRT utilizing 6 MV photons. 

## 2015-01-26 ENCOUNTER — Encounter: Payer: Self-pay | Admitting: Family

## 2015-01-26 ENCOUNTER — Ambulatory Visit (INDEPENDENT_AMBULATORY_CARE_PROVIDER_SITE_OTHER): Payer: Medicare Other | Admitting: Family

## 2015-01-26 VITALS — BP 132/82 | HR 66 | Temp 98.1°F | Resp 18 | Ht 69.0 in | Wt 193.1 lb

## 2015-01-26 DIAGNOSIS — G479 Sleep disorder, unspecified: Secondary | ICD-10-CM | POA: Diagnosis not present

## 2015-01-26 DIAGNOSIS — I10 Essential (primary) hypertension: Secondary | ICD-10-CM | POA: Diagnosis not present

## 2015-01-26 DIAGNOSIS — L0292 Furuncle, unspecified: Secondary | ICD-10-CM | POA: Diagnosis not present

## 2015-01-26 MED ORDER — SULFAMETHOXAZOLE-TRIMETHOPRIM 800-160 MG PO TABS: 1.0000 | ORAL_TABLET | Freq: Two times a day (BID) | ORAL | Status: DC

## 2015-01-26 MED ORDER — TEMAZEPAM 30 MG PO CAPS: 30.0000 mg | ORAL_CAPSULE | Freq: Every evening | ORAL | Status: DC | PRN

## 2015-01-26 NOTE — Assessment & Plan Note (Signed)
Blood pressure remains controlled with current regimen and below goal of 140/90. Continue current dosage of amlodipine, benazepril, and hydrochlorothiazide. Continue to monitor blood pressures at home. Follow-up in 6 months.

## 2015-01-26 NOTE — Assessment & Plan Note (Signed)
Continues to experience some difficulties in falling asleep. Notes medication addition of temazepam has helped. Increase temazepam to 30 mg nightly as needed for sleep. Follow-up if symptoms worsen or fail to improve.

## 2015-01-26 NOTE — Patient Instructions (Signed)
  Thank you for choosing [facility].  Summary/Instructions:  Your prescription(s) have been submitted to your pharmacy or been printed and provided for you. Please take as directed and contact our office if you believe you are having problem(s) with the medication(s) or have any questions.  For sleep please increase the temazepam to 30 mg nightly and add melatonin to assist which is available over the counter.  For your neck and hip, please start taking the Bactrim and take until completion unless instructed otherwise.  If your symptoms worsen or fail to improve, please contact our office for further instruction, or in case of emergency go directly to the emergency room at the closest medical facility.

## 2015-01-26 NOTE — Assessment & Plan Note (Signed)
Symptoms consistent with a boil. Start doxycycline to cover for MRSA. Follow-up if symptoms worsen or fail to improve.

## 2015-01-26 NOTE — Progress Notes (Signed)
Subjective:    Patient ID: John Sloan, male    DOB: August 29, 1955, 59 y.o.   MRN: [id number]  Chief Complaint  Patient presents with  . Follow-up    still having trouble sleeping all night, has a place on his side that has been there a while but is now starting to bother him, referral for dermatology    HPI:  Roberto Romanoski is a 59 y.o. male with a PMH of hypertension, type 2 diabetes, prostate cancer, hyperlipidemia, hepatitis C, iron deficiency, and sleep disturbance who presents today for office follow-up.  1.) Hypertension - currently maintained on amlodipine, benazepril, and hydrochlorothiazide. Takes his medication as prescribed and denies adverse side effects. Denies any cardiovascular symptoms.  BP Readings from Last 3 Encounters:  01/26/15 132/82  12/26/14 112/78  12/12/14 131/89    2.) Sleep disturbance - previously seen in the office for sleep disturbance and difficulty falling asleep with a history of restless leg syndrome. He was started on temazepam at the time. Indicates he takes medications prescribed and denies adverse side effects. Does note that he has difficulty sleeping all night but did have some improvement with the medication.   3.) Spot on chin - Associated symptom of a "blackhead" has been there for a long while. There are no modifying factors that make it better. Has tried to squeeze and pop it. There is discharge on occasion that is white and has an odor to it.    No Known Allergies   Current Outpatient Prescriptions on File Prior to Visit  Medication Sig Dispense Refill  . amLODipine (NORVASC) 10 MG tablet Take 1 tablet (10 mg total) by mouth daily. 90 tablet 3  . aspirin 81 MG tablet Take 81 mg by mouth daily.    . benazepril (LOTENSIN) 20 MG tablet Take 1 tablet (20 mg total) by mouth daily. 90 tablet 2  . cyclobenzaprine (FLEXERIL) 5 MG tablet Take 5 mg by mouth 3 (three) times daily as needed for muscle spasms.    . ferrous sulfate 325 (65  FE) MG tablet Take 325 mg by mouth daily with breakfast.     . hydrochlorothiazide (HYDRODIURIL) 25 MG tablet Take 1 tablet (25 mg total) by mouth daily. 90 tablet 3  . lovastatin (MEVACOR) 40 MG tablet Take 1 tablet (40 mg total) by mouth at bedtime. 90 tablet 2  . naproxen (NAPROSYN) 500 MG tablet Take 500 mg by mouth as needed.    Marland Kitchen rOPINIRole (REQUIP) 0.5 MG tablet 1 tab po qAM, 1 tab po q Mid-day, and 2-3 tabs po qhs 150 tablet 6  . sildenafil (VIAGRA) 50 MG tablet Take 50 mg by mouth daily as needed for erectile dysfunction.    . tamsulosin (FLOMAX) 0.4 MG CAPS capsule Take 0.4 mg by mouth daily after supper.     No current facility-administered medications on file prior to visit.    Review of Systems  Eyes:       Negative for changes in vision.   Respiratory: Negative for chest tightness and shortness of breath.   Cardiovascular: Negative for chest pain, palpitations and leg swelling.  Neurological: Negative for light-headedness.      Objective:    BP 132/82 mmHg  Pulse 66  Temp(Src) 98.1 F (36.7 C) (Oral)  Resp 18  Ht 5\' 9"  (1.753 m)  Wt 193 lb 1.9 oz (87.599 kg)  BMI 28.51 kg/m2  SpO2 97% Nursing note and vital signs reviewed.  Physical Exam  Constitutional: He is oriented  to person, place, and time. He appears well-developed and well-nourished. No distress.  Cardiovascular: Normal rate, regular rhythm, normal heart sounds and intact distal pulses.   Pulmonary/Chest: Effort normal and breath sounds normal.  Neurological: He is alert and oriented to person, place, and time.  Skin: Skin is warm and dry.  Half centimeter boil noted on underside of chin. There is no obvious discharge or discoloration. Lesion is firm and mobile.  Psychiatric: He has a normal mood and affect. His behavior is normal. Judgment and thought content normal.       Assessment & Plan:   Problem List Items Addressed This Visit      Cardiovascular and Mediastinum   HTN (hypertension), benign     Blood pressure remains controlled with current regimen and below goal of 140/90. Continue current dosage of amlodipine, benazepril, and hydrochlorothiazide. Continue to monitor blood pressures at home. Follow-up in 6 months.        Musculoskeletal and Integument   Boil    Symptoms consistent with a boil. Start doxycycline to cover for MRSA. Follow-up if symptoms worsen or fail to improve.      Relevant Medications   sulfamethoxazole-trimethoprim (BACTRIM DS,SEPTRA DS) 800-160 MG per tablet     Other   Sleep disturbance - Primary    Continues to experience some difficulties in falling asleep. Notes medication addition of temazepam has helped. Increase temazepam to 30 mg nightly as needed for sleep. Follow-up if symptoms worsen or fail to improve.      Relevant Medications   temazepam (RESTORIL) 30 MG capsule

## 2015-01-26 NOTE — Progress Notes (Signed)
Pre visit review using our clinic review tool, if applicable. No additional management support is needed unless otherwise documented below in the visit note. 

## 2015-01-28 DIAGNOSIS — C61 Malignant neoplasm of prostate: Secondary | ICD-10-CM | POA: Diagnosis not present

## 2015-01-31 ENCOUNTER — Ambulatory Visit
Admission: RE | Admit: 2015-01-31 | Discharge: 2015-01-31 | Disposition: A | Discharge status: Home / Self Care | Payer: Medicare Other | Source: Ambulatory Visit | Attending: Radiation Oncology | Admitting: Radiation Oncology

## 2015-01-31 DIAGNOSIS — C61 Malignant neoplasm of prostate: Secondary | ICD-10-CM | POA: Diagnosis not present

## 2015-02-01 ENCOUNTER — Ambulatory Visit
Admission: RE | Admit: 2015-02-01 | Discharge: 2015-02-01 | Disposition: A | Discharge status: Home / Self Care | Payer: Medicare Other | Source: Ambulatory Visit | Attending: Radiation Oncology | Admitting: Radiation Oncology

## 2015-02-01 DIAGNOSIS — C61 Malignant neoplasm of prostate: Secondary | ICD-10-CM | POA: Diagnosis not present

## 2015-02-02 ENCOUNTER — Ambulatory Visit
Admission: RE | Admit: 2015-02-02 | Discharge: 2015-02-02 | Disposition: A | Discharge status: Home / Self Care | Payer: Medicare Other | Source: Ambulatory Visit | Attending: Radiation Oncology | Admitting: Radiation Oncology

## 2015-02-02 DIAGNOSIS — C61 Malignant neoplasm of prostate: Secondary | ICD-10-CM | POA: Diagnosis not present

## 2015-02-06 ENCOUNTER — Encounter: Payer: Self-pay | Admitting: Radiation Oncology

## 2015-02-06 ENCOUNTER — Ambulatory Visit
Admission: RE | Admit: 2015-02-06 | Discharge: 2015-02-06 | Disposition: A | Discharge status: Home / Self Care | Payer: Medicare Other | Source: Ambulatory Visit | Attending: Radiation Oncology | Admitting: Radiation Oncology

## 2015-02-06 VITALS — BP 107/69 | HR 67 | Temp 98.6°F | Resp 20 | Wt 193.8 lb

## 2015-02-06 DIAGNOSIS — C61 Malignant neoplasm of prostate: Secondary | ICD-10-CM | POA: Diagnosis not present

## 2015-02-06 MED ORDER — TAMSULOSIN HCL 0.4 MG PO CAPS: 0.4000 mg | ORAL_CAPSULE | Freq: Every day | ORAL | Status: DC

## 2015-02-06 NOTE — Progress Notes (Addendum)
Weekly rad xts prostate,4/40 completed,  pt education done, radiation therapy and yopu book, discussed ways to manage symptoms, drink plenty water, have full bladder when coming for rad txs, fatigue, frequency,urgency,urinary burning, rectal discomfort, on flomax already, slight urgency, regular bowel movements,  Energy level okay, still smokes 1/2 ppd cigarettes, needs refuill on flomax 2:12 PM

## 2015-02-06 NOTE — Progress Notes (Signed)
Weekly Management Note:  Site: Prostate Current Dose:  780  cGy Projected Dose: 7800  cGy  Narrative: The patient is seen today for routine under treatment assessment. CBCT/MVCT images/port films were reviewed. The chart was reviewed.   Bladder filling is satisfactory but less than ideal.  His filling is approximately two thirds of the bladder volume compared to his simulation CT.  No new GU or GI difficulties.  He requests a refill of his tamsulosin which was last prescribed by his doctor in [place]  Physical Examination:  Filed Vitals:   02/06/15 1413  BP: 107/69  Pulse: 67  Temp: 98.6 F (37 C)  Resp: 20  .  Weight: 193 lb 12.8 oz (87.907 kg).  No change.  Impression: Tolerating radiation therapy well.  I encouraged him to improve his bladder filling.  Plan: Continue radiation therapy as planned.

## 2015-02-07 ENCOUNTER — Ambulatory Visit
Admission: RE | Admit: 2015-02-07 | Discharge: 2015-02-07 | Disposition: A | Discharge status: Home / Self Care | Payer: Medicare Other | Source: Ambulatory Visit | Attending: Radiation Oncology | Admitting: Radiation Oncology

## 2015-02-07 DIAGNOSIS — C61 Malignant neoplasm of prostate: Secondary | ICD-10-CM | POA: Diagnosis not present

## 2015-02-08 ENCOUNTER — Ambulatory Visit
Admission: RE | Admit: 2015-02-08 | Discharge: 2015-02-08 | Disposition: A | Discharge status: Home / Self Care | Payer: Medicare Other | Source: Ambulatory Visit | Attending: Radiation Oncology | Admitting: Radiation Oncology

## 2015-02-08 DIAGNOSIS — C61 Malignant neoplasm of prostate: Secondary | ICD-10-CM | POA: Diagnosis not present

## 2015-02-09 ENCOUNTER — Ambulatory Visit
Admission: RE | Admit: 2015-02-09 | Discharge: 2015-02-09 | Disposition: A | Discharge status: Home / Self Care | Payer: Medicare Other | Source: Ambulatory Visit | Attending: Radiation Oncology | Admitting: Radiation Oncology

## 2015-02-09 DIAGNOSIS — C61 Malignant neoplasm of prostate: Secondary | ICD-10-CM | POA: Diagnosis not present

## 2015-02-12 ENCOUNTER — Ambulatory Visit
Admission: RE | Admit: 2015-02-12 | Discharge: 2015-02-12 | Disposition: A | Discharge status: Home / Self Care | Payer: Medicare Other | Source: Ambulatory Visit | Attending: Radiation Oncology | Admitting: Radiation Oncology

## 2015-02-12 VITALS — BP 130/84 | HR 56 | Temp 98.7°F | Resp 12 | Wt 196.4 lb

## 2015-02-12 DIAGNOSIS — C61 Malignant neoplasm of prostate: Secondary | ICD-10-CM

## 2015-02-12 NOTE — Progress Notes (Signed)
PAIN: He rates his pain as a 4 on a scale of 0-10. intermittent and aching over bilateral groin. URINARY: Pt reports urinary urgency and hesitation. Pt states they urinate 3 - 4 times per night.  BOWEL: Pt reports soft bowel movements every other day.  BP 130/84 mmHg  Pulse 56  Temp(Src) 98.7 F (37.1 C) (Oral)  Resp 12  Wt 196 lb 6.4 oz (89.086 kg)  SpO2 100% Wt Readings from Last 3 Encounters:  02/12/15 196 lb 6.4 oz (89.086 kg)  02/06/15 193 lb 12.8 oz (87.907 kg)  01/26/15 193 lb 1.9 oz (87.599 kg)

## 2015-02-12 NOTE — Progress Notes (Signed)
   Weekly Management Note:  Outpatient    ICD-9-CM ICD-10-CM   1. Prostate cancer 185 C61     Current Dose:  15.6 Gy  Projected Dose: 78 Gy   Narrative:  The patient presents for routine under treatment assessment.  CBCT/MVCT images/Port film x-rays were reviewed.  The chart was checked. Bilateral groin aching reported; urinary sx stable. On Flomax  Physical Findings:  weight is 196 lb 6.4 oz (89.086 kg). His oral temperature is 98.7 F (37.1 C). His blood pressure is 130/84 and his pulse is 56. His respiration is 12 and oxygen saturation is 100%.   Wt Readings from Last 3 Encounters:  02/12/15 196 lb 6.4 oz (89.086 kg)  02/06/15 193 lb 12.8 oz (87.907 kg)  01/26/15 193 lb 1.9 oz (87.599 kg)   NAD  Impression:  The patient is tolerating radiotherapy.  Plan:  Continue radiotherapy as planned.    ________________________________   Eppie Gibson, M.D.

## 2015-02-13 ENCOUNTER — Ambulatory Visit
Admission: RE | Admit: 2015-02-13 | Discharge: 2015-02-13 | Disposition: A | Discharge status: Home / Self Care | Payer: Medicare Other | Source: Ambulatory Visit | Attending: Radiation Oncology | Admitting: Radiation Oncology

## 2015-02-13 DIAGNOSIS — C61 Malignant neoplasm of prostate: Secondary | ICD-10-CM | POA: Diagnosis not present

## 2015-02-14 ENCOUNTER — Ambulatory Visit
Admission: RE | Admit: 2015-02-14 | Discharge: 2015-02-14 | Disposition: A | Discharge status: Home / Self Care | Payer: Medicare Other | Source: Ambulatory Visit | Attending: Radiation Oncology | Admitting: Radiation Oncology

## 2015-02-14 DIAGNOSIS — C61 Malignant neoplasm of prostate: Secondary | ICD-10-CM | POA: Diagnosis not present

## 2015-02-15 ENCOUNTER — Ambulatory Visit
Admission: RE | Admit: 2015-02-15 | Discharge: 2015-02-15 | Disposition: A | Discharge status: Home / Self Care | Payer: Medicare Other | Source: Ambulatory Visit | Attending: Radiation Oncology | Admitting: Radiation Oncology

## 2015-02-15 DIAGNOSIS — C61 Malignant neoplasm of prostate: Secondary | ICD-10-CM | POA: Diagnosis not present

## 2015-02-16 ENCOUNTER — Ambulatory Visit
Admission: RE | Admit: 2015-02-16 | Discharge: 2015-02-16 | Disposition: A | Discharge status: Home / Self Care | Payer: Medicare Other | Source: Ambulatory Visit | Attending: Radiation Oncology | Admitting: Radiation Oncology

## 2015-02-16 DIAGNOSIS — C61 Malignant neoplasm of prostate: Secondary | ICD-10-CM | POA: Diagnosis not present

## 2015-02-19 ENCOUNTER — Ambulatory Visit
Admission: RE | Admit: 2015-02-19 | Discharge: 2015-02-19 | Disposition: A | Discharge status: Home / Self Care | Payer: Medicare Other | Source: Ambulatory Visit | Attending: Radiation Oncology | Admitting: Radiation Oncology

## 2015-02-19 VITALS — BP 113/68 | HR 68 | Temp 98.0°F | Resp 12 | Wt 193.0 lb

## 2015-02-19 DIAGNOSIS — C61 Malignant neoplasm of prostate: Secondary | ICD-10-CM

## 2015-02-19 NOTE — Progress Notes (Signed)
Weekly Management Note:  Site: Prostate Current Dose:  2535  cGy Projected Dose: 7800  cGy  Narrative: The patient is seen today for routine under treatment assessment. CBCT/MVCT images/port films were reviewed. The chart was reviewed.   Bladder filling is satisfactory.  No new GU or GI difficulty although does have mild urinary frequency, urgency, hesitancy, and slight dysuria along with nocturia 3-4.  Physical Examination:  Filed Vitals:   02/19/15 0835  BP: 113/68  Pulse: 68  Temp: 98 F (36.7 C)  Resp: 12  .  Weight: 193 lb (87.544 kg).  No change.  Impression: Tolerating radiation therapy well.  If he has worsening obstructive symptoms and we may start him on tamsulosin.  Plan: Continue radiation therapy as planned.

## 2015-02-19 NOTE — Progress Notes (Signed)
PAIN: He is currently in no pain URINARY: Pt reports urinary frequency, urgency, hesistency and pain with urination. Pt states they urinate 3 - 4 times per night.  BOWEL: Pt reports Diarrhea happening a few times a week. BP 113/68 mmHg  Pulse 68  Temp(Src) 98 F (36.7 C) (Oral)  Resp 12  Wt 193 lb (87.544 kg)  SpO2 100% Wt Readings from Last 3 Encounters:  02/19/15 193 lb (87.544 kg)  02/12/15 196 lb 6.4 oz (89.086 kg)  02/06/15 193 lb 12.8 oz (87.907 kg)

## 2015-02-20 ENCOUNTER — Ambulatory Visit
Admission: RE | Admit: 2015-02-20 | Discharge: 2015-02-20 | Disposition: A | Discharge status: Home / Self Care | Payer: Medicare Other | Source: Ambulatory Visit | Attending: Radiation Oncology | Admitting: Radiation Oncology

## 2015-02-20 DIAGNOSIS — C61 Malignant neoplasm of prostate: Secondary | ICD-10-CM | POA: Diagnosis not present

## 2015-02-21 ENCOUNTER — Ambulatory Visit
Admission: RE | Admit: 2015-02-21 | Discharge: 2015-02-21 | Disposition: A | Discharge status: Home / Self Care | Payer: Medicare Other | Source: Ambulatory Visit | Attending: Radiation Oncology | Admitting: Radiation Oncology

## 2015-02-21 DIAGNOSIS — C61 Malignant neoplasm of prostate: Secondary | ICD-10-CM | POA: Diagnosis not present

## 2015-02-22 ENCOUNTER — Ambulatory Visit
Admission: RE | Admit: 2015-02-22 | Discharge: 2015-02-22 | Disposition: A | Discharge status: Home / Self Care | Payer: Medicare Other | Source: Ambulatory Visit | Attending: Radiation Oncology | Admitting: Radiation Oncology

## 2015-02-22 DIAGNOSIS — C61 Malignant neoplasm of prostate: Secondary | ICD-10-CM | POA: Diagnosis not present

## 2015-02-23 ENCOUNTER — Ambulatory Visit
Admission: RE | Admit: 2015-02-23 | Discharge: 2015-02-23 | Disposition: A | Discharge status: Home / Self Care | Payer: Medicare Other | Source: Ambulatory Visit | Attending: Radiation Oncology | Admitting: Radiation Oncology

## 2015-02-23 DIAGNOSIS — C61 Malignant neoplasm of prostate: Secondary | ICD-10-CM | POA: Diagnosis not present

## 2015-02-26 ENCOUNTER — Encounter: Payer: Self-pay | Admitting: Radiation Oncology

## 2015-02-26 ENCOUNTER — Ambulatory Visit
Admission: RE | Admit: 2015-02-26 | Discharge: 2015-02-26 | Disposition: A | Discharge status: Home / Self Care | Payer: Medicare Other | Source: Ambulatory Visit | Attending: Radiation Oncology | Admitting: Radiation Oncology

## 2015-02-26 VITALS — BP 118/79 | HR 62 | Temp 98.2°F | Resp 20 | Ht 69.0 in | Wt 190.5 lb

## 2015-02-26 DIAGNOSIS — C61 Malignant neoplasm of prostate: Secondary | ICD-10-CM | POA: Diagnosis not present

## 2015-02-26 NOTE — Progress Notes (Signed)
Weekly Management Note:  Site: Prostate Current Dose:  3510  cGy Projected Dose: 7800  cGy  Narrative: The patient is seen today for routine under treatment assessment. CBCT/MVCT images/port films were reviewed. The chart was reviewed.   Bladder filling is satisfactory today.  There were a few days last week that his bladder filling was not ideal.  He is on tamsulosin.  He still has nocturia 4.  He has had some loosening of his bowels, and he was told that he may take Imodium.  Of note is that he was placed on Bactrim last week through his primary care physician for a furuncle along his and developed lip swelling.  I wonder if he is allergic to sulfa.  He was also on temazepam.  Physical Examination:  Filed Vitals:   02/26/15 0848  BP: 118/79  Pulse: 62  Temp: 98.2 F (36.8 C)  Resp: 20  .  Weight: 190 lb 8 oz (86.41 kg).  No change.  Impression: Tolerating radiation therapy well.  Plan: Continue radiation therapy as planned.

## 2015-02-26 NOTE — Progress Notes (Signed)
John Sloan has completed 18 fractions to his prostate.  He denies pain.  He does report having a reaction to temazepam 30 mg and bactrim over the weekend.  His lower lip swelled up.  He has stopped taking them.  He reports that he constantly feels like he has to urinate.  He says when he does go, he doesn't go very much.  He is taking Flomax.  He denies hematuria.  He reports dysuria when he finishes urinating.  He reports nocturia 4 times a night.  He reports having diarrhea last week with about 2 bowel movements per day.  He has lost 3 lbs since last week.  Recommended he take Imodium if he has diarrhea again.  He reports some fatigue.  BP 118/79 mmHg  Pulse 62  Temp(Src) 98.2 F (36.8 C) (Oral)  Resp 20  Ht 5\' 9"  (1.753 m)  Wt 190 lb 8 oz (86.41 kg)  BMI 28.12 kg/m2

## 2015-02-27 ENCOUNTER — Ambulatory Visit
Admission: RE | Admit: 2015-02-27 | Discharge: 2015-02-27 | Disposition: A | Discharge status: Home / Self Care | Payer: Medicare Other | Source: Ambulatory Visit | Attending: Radiation Oncology | Admitting: Radiation Oncology

## 2015-02-27 DIAGNOSIS — C61 Malignant neoplasm of prostate: Secondary | ICD-10-CM | POA: Diagnosis not present

## 2015-02-28 ENCOUNTER — Ambulatory Visit
Admission: RE | Admit: 2015-02-28 | Discharge: 2015-02-28 | Disposition: A | Discharge status: Home / Self Care | Payer: Medicare Other | Source: Ambulatory Visit | Attending: Radiation Oncology | Admitting: Radiation Oncology

## 2015-02-28 DIAGNOSIS — C61 Malignant neoplasm of prostate: Secondary | ICD-10-CM | POA: Diagnosis not present

## 2015-03-01 ENCOUNTER — Ambulatory Visit
Admission: RE | Admit: 2015-03-01 | Discharge: 2015-03-01 | Disposition: A | Discharge status: Home / Self Care | Payer: Medicare Other | Source: Ambulatory Visit | Attending: Radiation Oncology | Admitting: Radiation Oncology

## 2015-03-01 DIAGNOSIS — C61 Malignant neoplasm of prostate: Secondary | ICD-10-CM | POA: Diagnosis not present

## 2015-03-02 ENCOUNTER — Ambulatory Visit
Admission: RE | Admit: 2015-03-02 | Discharge: 2015-03-02 | Disposition: A | Discharge status: Home / Self Care | Payer: Medicare Other | Source: Ambulatory Visit | Attending: Radiation Oncology | Admitting: Radiation Oncology

## 2015-03-02 DIAGNOSIS — C61 Malignant neoplasm of prostate: Secondary | ICD-10-CM | POA: Diagnosis not present

## 2015-03-05 ENCOUNTER — Ambulatory Visit
Admission: RE | Admit: 2015-03-05 | Discharge: 2015-03-05 | Disposition: A | Discharge status: Home / Self Care | Payer: Medicare Other | Source: Ambulatory Visit | Attending: Radiation Oncology | Admitting: Radiation Oncology

## 2015-03-05 DIAGNOSIS — C61 Malignant neoplasm of prostate: Secondary | ICD-10-CM | POA: Diagnosis not present

## 2015-03-06 ENCOUNTER — Ambulatory Visit
Admission: RE | Admit: 2015-03-06 | Discharge: 2015-03-06 | Disposition: A | Discharge status: Home / Self Care | Payer: Medicare Other | Source: Ambulatory Visit | Attending: Radiation Oncology | Admitting: Radiation Oncology

## 2015-03-06 ENCOUNTER — Encounter: Payer: Self-pay | Admitting: Radiation Oncology

## 2015-03-06 VITALS — BP 131/78 | HR 68 | Temp 98.1°F | Resp 20 | Wt 193.0 lb

## 2015-03-06 DIAGNOSIS — C61 Malignant neoplasm of prostate: Secondary | ICD-10-CM | POA: Diagnosis not present

## 2015-03-06 NOTE — Progress Notes (Signed)
Weekly Management Note:  Site: Prostate Current Dose:  4680  cGy Projected Dose: 7800  cGy  Narrative: The patient is seen today for routine under treatment assessment. CBCT/MVCT images/port films were reviewed. The chart was reviewed.   Bladder filling is satisfactory but less than optimal.  His urinary stream is somewhat slow and he does have nocturia 4.  He is on tamsulosin once a day.  Physical Examination:  Filed Vitals:   03/06/15 0819  BP: 131/78  Pulse: 68  Temp: 98.1 F (36.7 C)  Resp: 20  .  Weight: 193 lb (87.544 kg).  No change.  Impression: Tolerating radiation therapy well, except for worsening obstructive symptomatology while on tamsulosin.  I will have him increase his tamsulosin to a twice a day schedule. Plan: Continue radiation therapy as planned.

## 2015-03-06 NOTE — Progress Notes (Signed)
Weekly rad txs prostate 24/40 compleetd, slow stream ,nocturia 4x, no hematuria, takes flomax daily ,  Still has loose stools, restoril hasn't helped, slight fatigued, appetite good, request other sleep medication 8:22 AM BP 131/78 mmHg  Pulse 68  Temp(Src) 98.1 F (36.7 C) (Oral)  Resp 20  Wt 193 lb (87.544 kg)  Wt Readings from Last 3 Encounters:  03/06/15 193 lb (87.544 kg)  02/26/15 190 lb 8 oz (86.41 kg)  02/19/15 193 lb (87.544 kg)

## 2015-03-07 ENCOUNTER — Ambulatory Visit
Admission: RE | Admit: 2015-03-07 | Discharge: 2015-03-07 | Disposition: A | Discharge status: Home / Self Care | Payer: Medicare Other | Source: Ambulatory Visit | Attending: Radiation Oncology | Admitting: Radiation Oncology

## 2015-03-07 DIAGNOSIS — C61 Malignant neoplasm of prostate: Secondary | ICD-10-CM | POA: Diagnosis not present

## 2015-03-08 ENCOUNTER — Ambulatory Visit
Admission: RE | Admit: 2015-03-08 | Discharge: 2015-03-08 | Disposition: A | Discharge status: Home / Self Care | Payer: Medicare Other | Source: Ambulatory Visit | Attending: Radiation Oncology | Admitting: Radiation Oncology

## 2015-03-08 DIAGNOSIS — C61 Malignant neoplasm of prostate: Secondary | ICD-10-CM | POA: Diagnosis not present

## 2015-03-09 ENCOUNTER — Ambulatory Visit
Admission: RE | Admit: 2015-03-09 | Discharge: 2015-03-09 | Disposition: A | Discharge status: Home / Self Care | Payer: Medicare Other | Source: Ambulatory Visit | Attending: Radiation Oncology | Admitting: Radiation Oncology

## 2015-03-09 ENCOUNTER — Telehealth: Payer: Self-pay | Admitting: *Deleted

## 2015-03-09 ENCOUNTER — Other Ambulatory Visit: Payer: Self-pay | Admitting: Radiation Oncology

## 2015-03-09 DIAGNOSIS — C61 Malignant neoplasm of prostate: Secondary | ICD-10-CM

## 2015-03-09 MED ORDER — TAMSULOSIN HCL 0.4 MG PO CAPS: 0.4000 mg | ORAL_CAPSULE | Freq: Two times a day (BID) | ORAL | Status: DC

## 2015-03-09 NOTE — Telephone Encounter (Signed)
caled patient home phne left vm he can pick up flomax rx  At [facility],  60 tabs,  To take 1 tab bid per directions 8:48 AM

## 2015-03-12 ENCOUNTER — Ambulatory Visit
Admission: RE | Admit: 2015-03-12 | Discharge: 2015-03-12 | Disposition: A | Discharge status: Home / Self Care | Payer: Medicare Other | Source: Ambulatory Visit | Attending: Radiation Oncology | Admitting: Radiation Oncology

## 2015-03-12 ENCOUNTER — Encounter: Payer: Self-pay | Admitting: Radiation Oncology

## 2015-03-12 VITALS — BP 129/73 | HR 58 | Temp 97.6°F | Resp 16 | Ht 69.0 in | Wt 196.0 lb

## 2015-03-12 DIAGNOSIS — Z510 Encounter for antineoplastic radiation therapy: Secondary | ICD-10-CM | POA: Diagnosis not present

## 2015-03-12 DIAGNOSIS — C61 Malignant neoplasm of prostate: Secondary | ICD-10-CM | POA: Insufficient documentation

## 2015-03-12 NOTE — Progress Notes (Signed)
John Sloan has completed 28 fractions to his prostate.  He denies pain.  He reports having increased urinary frequency.  He is getting up 4 times per night to urinate.  He reports burning at the end of urination.  He denies hematuria.  He reports his urinary stream is weaker towards the end.  He denies skin irritation.  He reports slight fatigue.  BP 129/73 mmHg  Pulse 58  Temp(Src) 97.6 F (36.4 C) (Oral)  Resp 16  Ht 5\' 9"  (1.753 m)  Wt 196 lb (88.905 kg)  BMI 28.93 kg/m2

## 2015-03-12 NOTE — Progress Notes (Signed)
   Weekly Management Note:  outpatient    ICD-9-CM ICD-10-CM   1. Prostate cancer 185 C61     Current Dose:  54.66 Gy  Projected Dose: 78 Gy   Narrative:  The patient presents for routine under treatment assessment.  CBCT/MVCT images/Port film x-rays were reviewed.  The chart was checked. Continued nocturia and some diarrhea this AM  Physical Findings:  height is 5\' 9"  (1.753 m) and weight is 196 lb (88.905 kg). His oral temperature is 97.6 F (36.4 C). His blood pressure is 129/73 and his pulse is 58. His respiration is 16.   Wt Readings from Last 3 Encounters:  03/12/15 196 lb (88.905 kg)  03/06/15 193 lb (87.544 kg)  02/26/15 190 lb 8 oz (86.41 kg)   NAD  Impression:  The patient is tolerating radiotherapy.  Plan:  Continue radiotherapy as planned. Pt will try imodium for diarrhea and will try drinking less in the late PM before bedtime. He will continue Flomax BID.  ________________________________   Eppie Gibson, M.D.

## 2015-03-13 ENCOUNTER — Ambulatory Visit
Admission: RE | Admit: 2015-03-13 | Discharge: 2015-03-13 | Disposition: A | Discharge status: Home / Self Care | Payer: Medicare Other | Source: Ambulatory Visit | Attending: Radiation Oncology | Admitting: Radiation Oncology

## 2015-03-13 DIAGNOSIS — Z510 Encounter for antineoplastic radiation therapy: Secondary | ICD-10-CM | POA: Diagnosis not present

## 2015-03-13 DIAGNOSIS — C61 Malignant neoplasm of prostate: Secondary | ICD-10-CM | POA: Diagnosis not present

## 2015-03-14 ENCOUNTER — Ambulatory Visit
Admission: RE | Admit: 2015-03-14 | Discharge: 2015-03-14 | Disposition: A | Discharge status: Home / Self Care | Payer: Medicare Other | Source: Ambulatory Visit | Attending: Radiation Oncology | Admitting: Radiation Oncology

## 2015-03-14 DIAGNOSIS — C61 Malignant neoplasm of prostate: Secondary | ICD-10-CM | POA: Diagnosis not present

## 2015-03-14 DIAGNOSIS — Z510 Encounter for antineoplastic radiation therapy: Secondary | ICD-10-CM | POA: Diagnosis not present

## 2015-03-15 ENCOUNTER — Ambulatory Visit
Admission: RE | Admit: 2015-03-15 | Discharge: 2015-03-15 | Disposition: A | Discharge status: Home / Self Care | Payer: Medicare Other | Source: Ambulatory Visit | Attending: Radiation Oncology | Admitting: Radiation Oncology

## 2015-03-15 DIAGNOSIS — Z510 Encounter for antineoplastic radiation therapy: Secondary | ICD-10-CM | POA: Diagnosis not present

## 2015-03-15 DIAGNOSIS — C61 Malignant neoplasm of prostate: Secondary | ICD-10-CM | POA: Diagnosis not present

## 2015-03-16 ENCOUNTER — Ambulatory Visit
Admission: RE | Admit: 2015-03-16 | Discharge: 2015-03-16 | Disposition: A | Discharge status: Home / Self Care | Payer: Medicare Other | Source: Ambulatory Visit | Attending: Radiation Oncology | Admitting: Radiation Oncology

## 2015-03-16 DIAGNOSIS — Z510 Encounter for antineoplastic radiation therapy: Secondary | ICD-10-CM | POA: Diagnosis not present

## 2015-03-16 DIAGNOSIS — C61 Malignant neoplasm of prostate: Secondary | ICD-10-CM | POA: Diagnosis not present

## 2015-03-19 ENCOUNTER — Ambulatory Visit
Admission: RE | Admit: 2015-03-19 | Discharge: 2015-03-19 | Disposition: A | Discharge status: Home / Self Care | Payer: Medicare Other | Source: Ambulatory Visit | Attending: Radiation Oncology | Admitting: Radiation Oncology

## 2015-03-19 ENCOUNTER — Encounter: Payer: Self-pay | Admitting: Radiation Oncology

## 2015-03-19 VITALS — BP 122/74 | HR 60 | Temp 97.7°F | Resp 20 | Wt 194.0 lb

## 2015-03-19 DIAGNOSIS — Z510 Encounter for antineoplastic radiation therapy: Secondary | ICD-10-CM | POA: Diagnosis not present

## 2015-03-19 DIAGNOSIS — C61 Malignant neoplasm of prostate: Secondary | ICD-10-CM | POA: Diagnosis not present

## 2015-03-19 NOTE — Progress Notes (Addendum)
Weekly rad txs prostate 33/40 completed,no dysuria, no hematuria, nocturia 3-4x , regular bowel movements, no changes from last week stated 8:26 AM

## 2015-03-19 NOTE — Progress Notes (Signed)
Weekly Management Note:  Site: Prostate Current Dose:  6435  cGy Projected Dose: 7800  cGy  Narrative: The patient is seen today for routine under treatment assessment. CBCT/MVCT images/port films were reviewed. The chart was reviewed.   Bladder filling is only 50% today.  He did not feel that his bladder was full.  No new GU or GI difficulties although he does have nocturia 3-4.  Physical Examination:  Filed Vitals:   03/19/15 0824  BP: 122/74  Pulse: 60  Temp: 97.7 F (36.5 C)  Resp: 20  .  Weight: 194 lb (87.998 kg).  No change.  Impression: Tolerating radiation therapy well.  I encouraged him to improve his bladder filling.  Plan: Continue radiation therapy as planned.

## 2015-03-20 ENCOUNTER — Ambulatory Visit
Admission: RE | Admit: 2015-03-20 | Discharge: 2015-03-20 | Disposition: A | Discharge status: Home / Self Care | Payer: Medicare Other | Source: Ambulatory Visit | Attending: Radiation Oncology | Admitting: Radiation Oncology

## 2015-03-20 DIAGNOSIS — Z510 Encounter for antineoplastic radiation therapy: Secondary | ICD-10-CM | POA: Diagnosis not present

## 2015-03-20 DIAGNOSIS — C61 Malignant neoplasm of prostate: Secondary | ICD-10-CM | POA: Diagnosis not present

## 2015-03-21 ENCOUNTER — Ambulatory Visit
Admission: RE | Admit: 2015-03-21 | Discharge: 2015-03-21 | Disposition: A | Discharge status: Home / Self Care | Payer: Medicare Other | Source: Ambulatory Visit | Attending: Radiation Oncology | Admitting: Radiation Oncology

## 2015-03-21 DIAGNOSIS — C61 Malignant neoplasm of prostate: Secondary | ICD-10-CM | POA: Diagnosis not present

## 2015-03-21 DIAGNOSIS — Z510 Encounter for antineoplastic radiation therapy: Secondary | ICD-10-CM | POA: Diagnosis not present

## 2015-03-22 ENCOUNTER — Ambulatory Visit
Admission: RE | Admit: 2015-03-22 | Discharge: 2015-03-22 | Disposition: A | Discharge status: Home / Self Care | Payer: Medicare Other | Source: Ambulatory Visit | Attending: Radiation Oncology | Admitting: Radiation Oncology

## 2015-03-22 DIAGNOSIS — Z510 Encounter for antineoplastic radiation therapy: Secondary | ICD-10-CM | POA: Diagnosis not present

## 2015-03-22 DIAGNOSIS — C61 Malignant neoplasm of prostate: Secondary | ICD-10-CM | POA: Diagnosis not present

## 2015-03-23 ENCOUNTER — Ambulatory Visit
Admission: RE | Admit: 2015-03-23 | Discharge: 2015-03-23 | Disposition: A | Discharge status: Home / Self Care | Payer: Medicare Other | Source: Ambulatory Visit | Attending: Radiation Oncology | Admitting: Radiation Oncology

## 2015-03-23 DIAGNOSIS — Z510 Encounter for antineoplastic radiation therapy: Secondary | ICD-10-CM | POA: Diagnosis not present

## 2015-03-23 DIAGNOSIS — C61 Malignant neoplasm of prostate: Secondary | ICD-10-CM | POA: Diagnosis not present

## 2015-03-26 ENCOUNTER — Ambulatory Visit
Admission: RE | Admit: 2015-03-26 | Discharge: 2015-03-26 | Disposition: A | Discharge status: Home / Self Care | Payer: Medicare Other | Source: Ambulatory Visit | Attending: Radiation Oncology | Admitting: Radiation Oncology

## 2015-03-26 ENCOUNTER — Encounter: Payer: Self-pay | Admitting: Radiation Oncology

## 2015-03-26 VITALS — BP 125/78 | HR 63 | Resp 16 | Wt 194.7 lb

## 2015-03-26 DIAGNOSIS — C61 Malignant neoplasm of prostate: Secondary | ICD-10-CM | POA: Diagnosis not present

## 2015-03-26 DIAGNOSIS — Z510 Encounter for antineoplastic radiation therapy: Secondary | ICD-10-CM | POA: Diagnosis not present

## 2015-03-26 NOTE — Progress Notes (Addendum)
Weight and vitals stable. Denies pain. Reports nocturia x 4. Reports mild dysuria. Denies incontinence or leakage. Reports blood in stool last Tuesday but, none since. Reports diarrhea. Reports occasional urinary urgency. One month follow up appointment card given.  BP 125/78 mmHg  Pulse 63  Resp 16  Wt 194 lb 11.2 oz (88.315 kg) Wt Readings from Last 3 Encounters:  03/26/15 194 lb 11.2 oz (88.315 kg)  03/19/15 194 lb (87.998 kg)  03/12/15 196 lb (88.905 kg)

## 2015-03-26 NOTE — Progress Notes (Signed)
Weekly Management Note:  Site: Prostate Current Dose:  7410  cGy Projected Dose: 7800  cGy  Narrative: The patient is seen today for routine under treatment assessment. CBCT/MVCT images/port films were reviewed. The chart was reviewed.   Bladder filling is satisfactory.  No new GU or GI difficulties.  He did have a small amount of blood with his stool last Tuesday, but none since.  He does have nocturia 4.  He will finish his radiation therapy this Wednesday.  Physical Examination:  Filed Vitals:   03/26/15 0827  BP: 125/78  Pulse: 63  Resp: 16  .  Weight: 194 lb 11.2 oz (88.315 kg).  No change.  Impression: Tolerating radiation therapy well.  Plan: Continue radiation therapy as planned.  One-month follow-up after completion of radiation therapy this Wednesday.

## 2015-03-27 ENCOUNTER — Ambulatory Visit
Admission: RE | Admit: 2015-03-27 | Discharge: 2015-03-27 | Disposition: A | Discharge status: Home / Self Care | Payer: Medicare Other | Source: Ambulatory Visit | Attending: Radiation Oncology | Admitting: Radiation Oncology

## 2015-03-27 DIAGNOSIS — C61 Malignant neoplasm of prostate: Secondary | ICD-10-CM | POA: Diagnosis not present

## 2015-03-27 DIAGNOSIS — Z510 Encounter for antineoplastic radiation therapy: Secondary | ICD-10-CM | POA: Diagnosis not present

## 2015-03-28 ENCOUNTER — Encounter: Payer: Self-pay | Admitting: Radiation Oncology

## 2015-03-28 ENCOUNTER — Ambulatory Visit
Admission: RE | Admit: 2015-03-28 | Discharge: 2015-03-28 | Disposition: A | Discharge status: Home / Self Care | Payer: Medicare Other | Source: Ambulatory Visit | Attending: Radiation Oncology | Admitting: Radiation Oncology

## 2015-03-28 DIAGNOSIS — C61 Malignant neoplasm of prostate: Secondary | ICD-10-CM | POA: Diagnosis not present

## 2015-03-28 DIAGNOSIS — Z510 Encounter for antineoplastic radiation therapy: Secondary | ICD-10-CM | POA: Diagnosis not present

## 2015-03-28 NOTE — Progress Notes (Signed)
Chart note: On 01/31/2015 the patient began his IMRT with dual ARC VMAT with 2 sets of dynamic MLCs corresponding to one set of IMRT treatment devices (57262).

## 2015-03-28 NOTE — Progress Notes (Signed)
[facility] End of Treatment Note  Name:John Sloan  Date: 03/28/2015 TRV:[id number] DOB:18-Apr-1956   Status:outpatient    CC: Mauricio Po, FNP  Dr. Dutch Gray  REFERRING PHYSICIAN:   Dr. Dutch Gray   DIAGNOSIS:  Stage TIc intermediate risk adenocarcinoma prostate  INDICATION FOR TREATMENT: Curative   TREATMENT DATES: 01/31/2015 through 03/28/2015                          SITE/DOSE:  Prostate 7800 cGy 40 sessions, seminal vesicles 5600 cGy in 40 sessions                          BEAMS/ENERGY:     6 MV photons with dual ARC VMAT IMRT              NARRATIVE:   Mr. Slone developed worsening obstructive symptoms during his course of therapy and we increased his Flomax to a twice a day schedule.  He had some difficulty maintaining a comfortably full bladder to minimize urinary toxicity.  He has some loosening of his bowels, not clearly related to his radiation therapy.  He had 1 episode of a small amount of blood with a bowel movement but otherwise tolerated his treatment well.                         PLAN: Routine followup in one month. Patient instructed to call if questions or worsening complaints in interim.

## 2015-04-19 ENCOUNTER — Ambulatory Visit (INDEPENDENT_AMBULATORY_CARE_PROVIDER_SITE_OTHER): Payer: Medicare Other | Admitting: Family Medicine

## 2015-04-19 VITALS — BP 102/62 | HR 69 | Temp 98.0°F | Resp 16 | Ht 68.0 in | Wt 192.8 lb

## 2015-04-19 DIAGNOSIS — T783XXA Angioneurotic edema, initial encounter: Secondary | ICD-10-CM

## 2015-04-19 NOTE — Patient Instructions (Signed)
Your blood pressure medicine called benazepril has a side effect of angioedema which causes parts of the face to swell spontaneously and at random. For this reason, and because your blood pressure is well controlled, you should stop taking benazepril. The amlodipine is safe and effective.   Angioedema Angioedema is a sudden swelling of tissues, often of the skin. It can occur on the face or genitals or in the abdomen or other body parts. The swelling usually develops over a short period and gets better in 24 to 48 hours. It often begins during the night and is found when the person wakes up. The person may also get red, itchy patches of skin (hives). Angioedema can be dangerous if it involves swelling of the air passages.  Depending on the cause, episodes of angioedema may only happen once, come back in unpredictable patterns, or repeat for several years and then gradually fade away.  CAUSES  Angioedema can be caused by an allergic reaction to various triggers. It can also result from nonallergic causes, including reactions to drugs, immune system disorders, viral infections, or an abnormal gene that is passed to you from your parents (hereditary). For some people with angioedema, the cause is unknown.  Some things that can trigger angioedema include:   Foods.   Medicines, such as ACE inhibitors, ARBs, nonsteroidal anti-inflammatory agents, or estrogen.   Latex.   Animal saliva.   Insect stings.   Dyes used in X-rays.   Mild injury.   Dental work.  Surgery.  Stress.   Sudden changes in temperature.   Exercise. SIGNS AND SYMPTOMS   Swelling of the skin.  Hives. If these are present, there is also intense itching.  Redness in the affected area.   Pain in the affected area.  Swollen lips or tongue.  Breathing problems. This may happen if the air passages swell.  Wheezing. If internal organs are involved, there may be:   Nausea.   Abdominal pain.    Vomiting.   Difficulty swallowing.   Difficulty passing urine. DIAGNOSIS   Your health care provider will examine the affected area and take a medical and family history.  Various tests may be done to help determine the cause. Tests may include:  Allergy skin tests to see if the problem is an allergic reaction.   Blood tests to check for hereditary angioedema.   Tests to check for underlying diseases that could cause the condition.   A review of your medicines, including over-the-counter medicines, may be done. TREATMENT  Treatment will depend on the cause of the angioedema. Possible treatments include:   Removal of anything that triggered the condition (such as stopping certain medicines).   Medicines to treat symptoms or prevent attacks. Medicines given may include:   Antihistamines.   Epinephrine injection.   Steroids.   Hospitalization may be required for severe attacks. If the air passages are affected, it can be an emergency. Tubes may need to be placed to keep the airway open. HOME CARE INSTRUCTIONS   Take all medicines as directed by your health care provider.  If you were given medicines for emergency allergy treatment, always carry them with you.  Wear a medical bracelet as directed by your health care provider.   Avoid known triggers. SEEK MEDICAL CARE IF:   You have repeat attacks of angioedema.   Your attacks are more frequent or more severe despite preventive measures.   You have hereditary angioedema and are considering having children. It is important to discuss  with your health care provider the risks of passing the condition on to your children. SEEK IMMEDIATE MEDICAL CARE IF:   You have severe swelling of the mouth, tongue, or lips.  You have difficulty breathing.   You have difficulty swallowing.   You faint. MAKE SURE YOU:  Understand these instructions.  Will watch your condition.  Will get help right away if you  are not doing well or get worse. Document Released: 09/29/2001 Document Revised: 12/05/2013 Document Reviewed: 03/14/2013 [facility] Patient Information 2015 [place], [place]. This information is not intended to replace advice given to you by your health care provider. Make sure you discuss any questions you have with your health care provider.

## 2015-04-19 NOTE — Progress Notes (Signed)
This chart was scribed for Robyn Haber, MD by Moises Blood, medical scribe at [facility].The patient was seen in exam room 11 and the patient's care was started at 12:18 PM.  Patient ID: John Sloan MRN: [id number], DOB: 02/21/1956, 59 y.o. Date of Encounter: 04/19/2015  Primary Physician: Mauricio Po, FNP  Chief Complaint:  Chief Complaint  Patient presents with   Oral Swelling    both lips swollen, started last night    HPI:  John Sloan is a 59 y.o. male who presents to [facility] complaining of right facial swelling that started last night after eating. He has no shortness of breath or pain. He has no dental irritation or pain. His wife noticed the swelling and recommended that he come to the office.   He's had similar symptoms before last month on the left side of his face.: he woke up with left side of his face swelling up.   He sees Dr. Elna Breslow as PCP at [facility].   He doesn't work as he is disabled.   Past Medical History  Diagnosis Date   HTN (hypertension)    Hyperlipemia    Erectile dysfunction    Restless legs syndrome    Osteoarthritis     Left knee, right hand, left shoulder--fluctuates with weather.    History of adenomatous polyp of colon    Fatty liver     bx done per pt   History of hepatitis C virus infection     treated 2014--cured per pt (s/p interferon)   Tobacco dependence    DM (diabetes mellitus)     "borderline" per pt report--was put on glimeperide beginning of 2015 [place]CP [place]   Prostate cancer 02/2012    Gleason 3+3=6: "low volume, low risk --active surveillance per [facility] out of state.  PSA 3.7 12/28/13, down slightly from 4.0 on 09/16/13.  Repeat PSA  04/27/14 was 4.66.  On initial visit with new local urologist, Dr. Alinda Money, PSA was 6.19-repeat bx as per protocol was done 09/11/14 and only 2 of 12 cores were pos for prostate adenocarcinoma (gleason score on these 2  was 3+4, slightly upgraded.        Home Meds: Prior to Admission medications   Medication Sig Start Date End Date Taking? Authorizing Provider  amLODipine (NORVASC) 10 MG tablet Take 1 tablet (10 mg total) by mouth daily. 12/26/14  Yes Golden Circle, FNP  aspirin 81 MG tablet Take 81 mg by mouth daily.   Yes Historical Provider, MD  benazepril (LOTENSIN) 20 MG tablet Take 1 tablet (20 mg total) by mouth daily. 01/11/15  Yes Golden Circle, FNP  cyclobenzaprine (FLEXERIL) 5 MG tablet Take 5 mg by mouth 3 (three) times daily as needed for muscle spasms.   Yes Historical Provider, MD  ferrous sulfate 325 (65 FE) MG tablet Take 325 mg by mouth daily with breakfast.    Yes Historical Provider, MD  glimepiride (AMARYL) 2 MG tablet Take 2 mg by mouth daily with breakfast.   Yes Historical Provider, MD  hydrochlorothiazide (HYDRODIURIL) 25 MG tablet Take 1 tablet (25 mg total) by mouth daily. 09/26/14  Yes Golden Circle, FNP  lovastatin (MEVACOR) 40 MG tablet Take 1 tablet (40 mg total) by mouth at bedtime. 01/11/15  Yes Golden Circle, FNP  naproxen (NAPROSYN) 500 MG tablet Take 500 mg by mouth as needed.   Yes Historical Provider, MD  rOPINIRole (REQUIP) 0.5  MG tablet 1 tab po qAM, 1 tab po q Mid-day, and 2-3 tabs po qhs 03/30/14  Yes Tammi Sou, MD  sildenafil (VIAGRA) 50 MG tablet Take 50 mg by mouth daily as needed for erectile dysfunction.   Yes Historical Provider, MD  tamsulosin (FLOMAX) 0.4 MG CAPS capsule Take 1 capsule (0.4 mg total) by mouth 2 (two) times daily. 03/09/15  Yes Arloa Koh, MD  temazepam (RESTORIL) 30 MG capsule Take 1 capsule (30 mg total) by mouth at bedtime as needed for sleep. Patient not taking: Reported on 04/19/2015 01/26/15   Golden Circle, FNP    Allergies:  Allergies  Allergen Reactions   Bactrim [Sulfamethoxazole-Trimethoprim] Swelling   Temazepam Swelling    30 mg tablet    Social History   Social History   Marital Status: Married     Spouse Name: N/A   Number of Children: 7   Years of Education: N/A   Occupational History   Not on file.   Social History Main Topics   Smoking status: Current Every Day Smoker -- 0.50 packs/day for 40 years    Types: Cigarettes   Smokeless tobacco: Never Used   Alcohol Use: Yes     Comment: socially   Drug Use: No     Comment: no marijuania x 5-6 months   Sexual Activity: Not on file   Other Topics Concern   Not on file   Social History Narrative   Married, 7 children, 22 grandchildren.   Relocated from [place] to [facility] 2015.   Occupation: disabled, former Therapist, occupational.   20 pack-yr tob hx, current as of 03/2014.   Alcohol: drinks about 1 case per week.  Denies probs from his drinking.   Drugs: hx of cocaine and marijuana abuse.  (IV drug use in the past).   Still smokes marijuana occasionally.  No cocaine since 2009.              Review of Systems: Constitutional: negative for chills, fever, night sweats, weight changes, or fatigue  HEENT: negative for vision changes, hearing loss, congestion, rhinorrhea, ST, epistaxis, or sinus pressure Cardiovascular: negative for chest pain or palpitations Respiratory: negative for hemoptysis, wheezing, shortness of breath, or cough Abdominal: negative for abdominal pain, nausea, vomiting, diarrhea, or constipation Dermatological: negative for rash; positive for facial swelling Neurologic: negative for headache, dizziness, or syncope All other systems reviewed and are otherwise negative with the exception to those above and in the HPI.  Physical Exam: Blood pressure 102/62, pulse 69, temperature 98 F (36.7 C), temperature source Oral, resp. rate 16, height 5\' 8"  (1.727 m), weight 192 lb 12.8 oz (87.454 kg), SpO2 98 %., Body mass index is 29.32 kg/(m^2). General: Well developed, well nourished, in no acute distress. Head: Normocephalic, atraumatic, eyes without discharge, sclera non-icteric, nares are without  discharge. Bilateral auditory canals clear, TM's are without perforation, pearly grey and translucent with reflective cone of light bilaterally. Oral cavity moist, posterior pharynx without exudate, erythema, peritonsillar abscess, or post nasal drip.  Neck: Supple. No thyromegaly. Full ROM. No lymphadenopathy. Lungs: Clear bilaterally to auscultation without wheezes, rales, or rhonchi. Breathing is unlabored. Heart: RRR with S1 S2. No murmurs, rubs, or gallops appreciated. Msk:  Strength and tone normal for age. Extremities/Skin: Warm and dry. No clubbing or cyanosis. No rashes or suspicious lesions. Patient's right cheek and right upper lip are moderately swollen. There is no posterior pharyngeal swelling. Neuro: Alert and oriented X 3. Moves all extremities spontaneously. Gait  is normal. CNII-XII grossly in tact. Psych:  Responds to questions appropriately with a normal affect.    ASSESSMENT AND PLAN:  59 y.o. year old male with right facial angioedema This chart was scribed in my presence and reviewed by me personally.    ICD-9-CM ICD-10-CM   1. Angioedema, initial encounter 995.1 T78.3XXA    patient told to stop taking the benazepril and follow his blood pressure with his primary care doctor   Signed, Robyn Haber, MD 04/19/2015 12:18 PM

## 2015-05-07 ENCOUNTER — Encounter: Payer: Self-pay | Admitting: Radiation Oncology

## 2015-05-08 ENCOUNTER — Encounter: Payer: Self-pay | Admitting: Radiation Oncology

## 2015-05-08 ENCOUNTER — Ambulatory Visit
Admission: RE | Admit: 2015-05-08 | Discharge: 2015-05-08 | Disposition: A | Discharge status: Home / Self Care | Payer: Medicare Other | Source: Ambulatory Visit | Attending: Radiation Oncology | Admitting: Radiation Oncology

## 2015-05-08 VITALS — BP 133/77 | HR 66 | Temp 97.9°F | Ht 68.0 in | Wt 197.6 lb

## 2015-05-08 DIAGNOSIS — C61 Malignant neoplasm of prostate: Secondary | ICD-10-CM

## 2015-05-08 HISTORY — DX: Personal history of irradiation: Z92.3

## 2015-05-08 NOTE — Progress Notes (Signed)
CC: Dr. Dutch Gray, John Po FNP    Follow-up note:   John Sloan returns today approximately 1 month following completion of external beam/IMRT in the management of his stage TIc intermediate risk adenocarcinoma prostate.  He continues to take tamsulosin twice a day. He believes that his urinary obstructive symptoms are improved. He does have nocturia 3.  He no longer has any rectal bleeding. He believes that he has a follow-up visit with Dr. Alinda Money in approximately 2 months. He has some tanning along his pelvis which she believes is secondary to radiation therapy.   Physical examination: Alert and oriented. Filed Vitals:   05/08/15 1024  BP: 133/77  Pulse: 66  Temp: 97.9 F (36.6 C)    On inspection of the pelvis there is circumferential tanning at the level of his pubis.  There is tinea cruris involving both groins. Rectal examination not performed.    Impression: Satisfactory progress. He'll see Dr. Alinda Money in approximately 2 months for a follow-up visit and PSA determination.  I told him that he may want to decrease his tamsulosin  to 1 dose a day. I suggested that he use miconazole powder or clotrimazole cream for his tinea cruris.    Plan: as above. I have not scheduled Mr. Downs for a formal follow-up visit , and I would appreciate Dr. Alinda Money keeping [name] posted on his progress.

## 2015-05-08 NOTE — Progress Notes (Signed)
[name] here for reassessemt s/p xrt to pelvis for prostate cancer. He reports that he is voiding "like" he was prior to radiation therapy with nocturia x 3.  No pain on voiding.  Has not seen urologist since completing XRT

## 2015-05-21 ENCOUNTER — Ambulatory Visit (INDEPENDENT_AMBULATORY_CARE_PROVIDER_SITE_OTHER): Payer: Medicare Other | Admitting: Family

## 2015-05-21 ENCOUNTER — Encounter: Payer: Self-pay | Admitting: Family

## 2015-05-21 VITALS — BP 146/80 | HR 75 | Temp 98.0°F | Resp 18 | Ht 68.0 in | Wt 194.4 lb

## 2015-05-21 DIAGNOSIS — L989 Disorder of the skin and subcutaneous tissue, unspecified: Secondary | ICD-10-CM | POA: Diagnosis not present

## 2015-05-21 DIAGNOSIS — G479 Sleep disorder, unspecified: Secondary | ICD-10-CM

## 2015-05-21 MED ORDER — ACETAMINOPHEN-CODEINE #3 300-30 MG PO TABS: 1.0000 | ORAL_TABLET | Freq: Every evening | ORAL | Status: DC | PRN

## 2015-05-21 NOTE — Assessment & Plan Note (Signed)
Continues to experience sleep disturbance most likely to related to cancer treatment and discomfort. Start Tylenol #3 as needed. Follow up if symptoms worsen or fail to improve.

## 2015-05-21 NOTE — Progress Notes (Signed)
Pre visit review using our clinic review tool, if applicable. No additional management support is needed unless otherwise documented below in the visit note. 

## 2015-05-21 NOTE — Assessment & Plan Note (Signed)
Small skin lesion of questionable origin that may need to be biopsied. Refer to dermatology for possible biopsy. Start Tylenol #3 as needed for discomfort and sleep. Follow up if symptoms worsen or fail to improve.

## 2015-05-21 NOTE — Progress Notes (Signed)
Subjective:    Patient ID: John Sloan, male    DOB: [birth year], 59 y.o.   MRN: [id number]  Chief Complaint  Patient presents with  . Follow-up    wants the boil on the side of his right leg rechecked    HPI:  John Sloan is a 59 y.o. male who  has a past medical history of HTN (hypertension); Hyperlipemia; Erectile dysfunction; Restless legs syndrome; Osteoarthritis; History of adenomatous polyp of colon; Fatty liver; History of hepatitis C virus infection; Tobacco dependence; DM (diabetes mellitus) ([place]); Prostate cancer ([place]) (02/2012); and S/P radiation therapy (01/31/2015 through 03/28/2015 ). and presents today for a follow up office visit.   1.) Boil -  Associated symptom of a lesion located on his right hip has been going of for a couple of years. Describes as sensitive to the touch with no discharge or other signs of infection.  2.) Sleep disturbance - Reports that he continues to have some difficulty with sleep which is most likely related to discomfort experienced in his hip and through his cancer treatments which have now completed. Reports getting up 3-4 times per night. Previously prescribed temazepam with a potential allergic reaction.    Allergies  Allergen Reactions  . Bactrim [Sulfamethoxazole-Trimethoprim] Swelling  . Benazepril Swelling  . Temazepam Swelling    30 mg tablet    Current Outpatient Prescriptions on File Prior to Visit  Medication Sig Dispense Refill  . amLODipine (NORVASC) 10 MG tablet Take 1 tablet (10 mg total) by mouth daily. 90 tablet 3  . aspirin 81 MG tablet Take 81 mg by mouth daily.    . ferrous sulfate 325 (65 FE) MG tablet Take 325 mg by mouth daily with breakfast.     . hydrochlorothiazide (HYDRODIURIL) 25 MG tablet Take 1 tablet (25 mg total) by mouth daily. 90 tablet 3  . lovastatin (MEVACOR) 40 MG tablet Take 1 tablet (40 mg total) by mouth at bedtime. 90 tablet 2  . naproxen  (NAPROSYN) 500 MG tablet Take 500 mg by mouth as needed.    Marland Kitchen rOPINIRole (REQUIP) 0.5 MG tablet 1 tab po qAM, 1 tab po q Mid-day, and 2-3 tabs po qhs 150 tablet 6  . tamsulosin (FLOMAX) 0.4 MG CAPS capsule Take 1 capsule (0.4 mg total) by mouth 2 (two) times daily. 60 capsule 2   No current facility-administered medications on file prior to visit.    Review of Systems  Constitutional: Negative for fever and chills.  Skin:       Positive for lesion      Objective:    BP 146/80 mmHg  Pulse 75  Temp(Src) 98 F (36.7 C) (Oral)  Resp 18  Ht 5\' 8"  (1.727 m)  Wt 194 lb 6.4 oz (88.179 kg)  BMI 29.57 kg/m2  SpO2 96% Nursing note and vital signs reviewed.  Physical Exam  Constitutional: He is oriented to person, place, and time. He appears well-developed and well-nourished. No distress.  Cardiovascular: Normal rate, regular rhythm, normal heart sounds and intact distal pulses.   Pulmonary/Chest: Effort normal and breath sounds normal.  Neurological: He is alert and oriented to person, place, and time.  Skin: Skin is warm and dry.  Small dime sized lesion that is raised with hard core. There is no evidence of infection or discharge.  Psychiatric: He has a normal mood and affect. His behavior is normal. Judgment and thought content normal.       Assessment & Plan:   Problem List  Items Addressed This Visit      Musculoskeletal and Integument   Skin lesion of right leg - Primary    Small skin lesion of questionable origin that may need to be biopsied. Refer to dermatology for possible biopsy. Start Tylenol #3 as needed for discomfort and sleep. Follow up if symptoms worsen or fail to improve.       Relevant Medications   acetaminophen-codeine (TYLENOL #3) 300-30 MG tablet   Other Relevant Orders   Ambulatory referral to Dermatology     Other   Sleep disturbance    Continues to experience sleep disturbance most likely to related to cancer treatment and discomfort. Start Tylenol  #3 as needed. Follow up if symptoms worsen or fail to improve.       Relevant Medications   acetaminophen-codeine (TYLENOL #3) 300-30 MG tablet

## 2015-05-21 NOTE — Patient Instructions (Signed)
Thank you for choosing [facility].  Summary/Instructions:  Your prescription(s) have been submitted to your pharmacy or been printed and provided for you. Please take as directed and contact our office if you believe you are having problem(s) with the medication(s) or have any questions.  Referrals have been made during this visit. You should expect to hear back from our schedulers in about 7-10 days in regards to establishing an appointment with the specialists we discussed.   If your symptoms worsen or fail to improve, please contact our office for further instruction, or in case of emergency go directly to the emergency room at the closest medical facility.     

## 2015-06-15 ENCOUNTER — Telehealth: Payer: Self-pay | Admitting: *Deleted

## 2015-06-15 DIAGNOSIS — G479 Sleep disorder, unspecified: Secondary | ICD-10-CM

## 2015-06-15 DIAGNOSIS — L989 Disorder of the skin and subcutaneous tissue, unspecified: Secondary | ICD-10-CM

## 2015-06-15 MED ORDER — ACETAMINOPHEN-CODEINE #3 300-30 MG PO TABS: 1.0000 | ORAL_TABLET | Freq: Every evening | ORAL | Status: DC | PRN

## 2015-06-15 NOTE — Telephone Encounter (Signed)
Left msg on triage requesting refill on his Tylenol #3...John Sloan

## 2015-06-15 NOTE — Telephone Encounter (Signed)
Medication refilled

## 2015-06-18 NOTE — Telephone Encounter (Signed)
Called pt spoke with pt wife inform rx ready for pick-up...John Sloan

## 2015-07-02 ENCOUNTER — Encounter: Payer: Self-pay | Admitting: *Deleted

## 2015-07-02 DIAGNOSIS — C61 Malignant neoplasm of prostate: Secondary | ICD-10-CM | POA: Diagnosis not present

## 2015-07-02 DIAGNOSIS — L708 Other acne: Secondary | ICD-10-CM | POA: Diagnosis not present

## 2015-07-02 DIAGNOSIS — L820 Inflamed seborrheic keratosis: Secondary | ICD-10-CM | POA: Diagnosis not present

## 2015-07-02 NOTE — Progress Notes (Signed)
Refill script for Flomax 0.4 mg tabs, 1 po BID 60 tabs total with 1 refill added per Dr. Arloa Koh to medication list

## 2015-07-02 NOTE — Addendum Note (Signed)
Encounter addended by: Benn Moulder, RN on: 07/02/2015  9:35 AM<BR>     Documentation filed: Medications

## 2015-07-06 DIAGNOSIS — C61 Malignant neoplasm of prostate: Secondary | ICD-10-CM | POA: Diagnosis not present

## 2015-07-09 ENCOUNTER — Encounter: Payer: Self-pay | Admitting: Family Medicine

## 2015-07-11 ENCOUNTER — Telehealth: Payer: Self-pay | Admitting: *Deleted

## 2015-07-11 ENCOUNTER — Other Ambulatory Visit: Payer: Self-pay

## 2015-07-11 DIAGNOSIS — G479 Sleep disorder, unspecified: Secondary | ICD-10-CM

## 2015-07-11 DIAGNOSIS — L989 Disorder of the skin and subcutaneous tissue, unspecified: Secondary | ICD-10-CM

## 2015-07-11 MED ORDER — ACETAMINOPHEN-CODEINE #3 300-30 MG PO TABS: 1.0000 | ORAL_TABLET | Freq: Every evening | ORAL | Status: DC | PRN

## 2015-07-11 MED ORDER — ROPINIROLE HCL 0.5 MG PO TABS: ORAL_TABLET | ORAL | Status: DC

## 2015-07-11 NOTE — Telephone Encounter (Signed)
Medication printed and signed.

## 2015-07-11 NOTE — Telephone Encounter (Signed)
Received call pt is requesting refill on  His Tylenol #3...John Sloan

## 2015-07-11 NOTE — Telephone Encounter (Signed)
Notified pt rx ready for pick-up.../lmb 

## 2015-08-07 ENCOUNTER — Telehealth: Payer: Self-pay | Admitting: *Deleted

## 2015-08-07 DIAGNOSIS — L989 Disorder of the skin and subcutaneous tissue, unspecified: Secondary | ICD-10-CM

## 2015-08-07 DIAGNOSIS — G479 Sleep disorder, unspecified: Secondary | ICD-10-CM

## 2015-08-07 MED ORDER — ACETAMINOPHEN-CODEINE #3 300-30 MG PO TABS: 1.0000 | ORAL_TABLET | Freq: Every evening | ORAL | Status: DC | PRN

## 2015-08-07 NOTE — Telephone Encounter (Signed)
Notified pt wife rx ready for pick-up../lmb 

## 2015-08-07 NOTE — Telephone Encounter (Signed)
Medication printed

## 2015-08-07 NOTE — Telephone Encounter (Signed)
Pt left msg on triage requesting refill on his Tylenol #3...John Sloan

## 2015-08-27 ENCOUNTER — Telehealth: Payer: Self-pay | Admitting: *Deleted

## 2015-08-27 DIAGNOSIS — G479 Sleep disorder, unspecified: Secondary | ICD-10-CM

## 2015-08-27 DIAGNOSIS — L989 Disorder of the skin and subcutaneous tissue, unspecified: Secondary | ICD-10-CM

## 2015-08-27 NOTE — Telephone Encounter (Signed)
Received call pt requesting refill on his Tylenol #3. Pt states he would like a 30 day supply...John Sloan

## 2015-08-28 NOTE — Telephone Encounter (Signed)
John Sloan is out of the office until Thurs pls advise on med refill...[name]

## 2015-08-29 MED ORDER — ACETAMINOPHEN-CODEINE #3 300-30 MG PO TABS: 1.0000 | ORAL_TABLET | Freq: Every evening | ORAL | Status: DC | PRN

## 2015-08-29 NOTE — Telephone Encounter (Signed)
Done hardcopy to [facility]  I gave reduced number tabs as I am not comfortable with chronic narcotic for the documented purpose, since i have not examined the patient

## 2015-08-29 NOTE — Telephone Encounter (Signed)
Notified pt wife with md response. Rx place in cabinet for pick-up...[name]

## 2015-09-11 ENCOUNTER — Telehealth: Payer: Self-pay | Admitting: *Deleted

## 2015-09-11 DIAGNOSIS — L989 Disorder of the skin and subcutaneous tissue, unspecified: Secondary | ICD-10-CM

## 2015-09-11 DIAGNOSIS — G479 Sleep disorder, unspecified: Secondary | ICD-10-CM

## 2015-09-11 NOTE — Telephone Encounter (Signed)
Left msg on triage stating he is needing refill on his Tylenol #3. He is wanting enough pills that will last him for 30 days...John Sloan

## 2015-09-12 MED ORDER — ACETAMINOPHEN-CODEINE #3 300-30 MG PO TABS: 1.0000 | ORAL_TABLET | Freq: Every evening | ORAL | Status: DC | PRN

## 2015-09-12 NOTE — Telephone Encounter (Signed)
Notified pt spoke with wife inform rx ready for pick-up.../lmb 

## 2015-09-12 NOTE — Telephone Encounter (Signed)
pls advise on msg below.../lmb 

## 2015-09-14 ENCOUNTER — Telehealth: Payer: Self-pay | Admitting: Oncology

## 2015-09-14 NOTE — Telephone Encounter (Signed)
Left a message regarding the refill request for Flomax.  Advised patient that this should go through Dr. Lynne Logan office as he does not have any follow ups scheduled with Radiation Oncology.

## 2015-10-09 ENCOUNTER — Telehealth: Payer: Self-pay | Admitting: *Deleted

## 2015-10-09 DIAGNOSIS — L989 Disorder of the skin and subcutaneous tissue, unspecified: Secondary | ICD-10-CM

## 2015-10-09 DIAGNOSIS — G479 Sleep disorder, unspecified: Secondary | ICD-10-CM

## 2015-10-09 MED ORDER — ACETAMINOPHEN-CODEINE #3 300-30 MG PO TABS: 1.0000 | ORAL_TABLET | Freq: Every evening | ORAL | Status: DC | PRN

## 2015-10-09 NOTE — Telephone Encounter (Signed)
Medication refilled

## 2015-10-09 NOTE — Telephone Encounter (Signed)
Received call pt requesting refill on his Tylenol #3...[name]

## 2015-10-09 NOTE — Telephone Encounter (Signed)
Notified pt rx ready for pick-up.../lmb 

## 2015-10-25 ENCOUNTER — Telehealth: Payer: Self-pay | Admitting: *Deleted

## 2015-10-25 MED ORDER — HYDROCHLOROTHIAZIDE 25 MG PO TABS: 25.0000 mg | ORAL_TABLET | Freq: Every day | ORAL | Status: DC

## 2015-10-25 NOTE — Telephone Encounter (Signed)
"  I need a refill on the medication Dr. Valere Dross ordered, Tamsulosin/flomax.  The pharmacy has not received a response yet."  Advised Dr. Valere Dross has retired.  RT notified and will call patient.  Currently at home [phone].  Called back at [phone] leaving mobile number [phone].

## 2015-10-25 NOTE — Telephone Encounter (Signed)
Received call ptstates he need refill on his HCTZ. Verified pharmacy sent to [facility]...John Sloan

## 2015-11-08 ENCOUNTER — Telehealth: Payer: Self-pay | Admitting: *Deleted

## 2015-11-08 DIAGNOSIS — G479 Sleep disorder, unspecified: Secondary | ICD-10-CM

## 2015-11-08 DIAGNOSIS — L989 Disorder of the skin and subcutaneous tissue, unspecified: Secondary | ICD-10-CM

## 2015-11-08 MED ORDER — ACETAMINOPHEN-CODEINE #3 300-30 MG PO TABS: 1.0000 | ORAL_TABLET | Freq: Every evening | ORAL | Status: DC | PRN

## 2015-11-08 NOTE — Telephone Encounter (Signed)
Medication refilled

## 2015-11-08 NOTE — Telephone Encounter (Signed)
Pt requesting refill on his Tylenol #3...John Sloan

## 2015-12-07 ENCOUNTER — Telehealth: Payer: Self-pay | Admitting: *Deleted

## 2015-12-07 DIAGNOSIS — G479 Sleep disorder, unspecified: Secondary | ICD-10-CM

## 2015-12-07 DIAGNOSIS — L989 Disorder of the skin and subcutaneous tissue, unspecified: Secondary | ICD-10-CM

## 2015-12-07 MED ORDER — ACETAMINOPHEN-CODEINE #3 300-30 MG PO TABS: 1.0000 | ORAL_TABLET | Freq: Every evening | ORAL | Status: DC | PRN

## 2015-12-07 NOTE — Telephone Encounter (Signed)
rx printed

## 2015-12-07 NOTE — Telephone Encounter (Signed)
Received call pt is requesting refill on his Tylenol #3. Last filled 11/08/15. Marya Amsler is out of office pls advise...[name]

## 2015-12-10 NOTE — Telephone Encounter (Signed)
Received call from pt someone qave him the rx for his Tylenol #3 unsigned, and he stated the pharmacy want fill it. Inform pt will contact [facility] to see if we are allowed to call rx in due it being an control, and I will give him a call back. Called pharmacy spoke w/Tara pharmacist verified if rx can be called into pharmacy. She stated yes, but he is not due until 5/13. Last month filled date 4/13, the earliest it can be refill is Thurs 5/11. Gave verbal for tylenol #3 inform [name] will have him to bring the rx to her and she will void  Written script.   Called pt inform him to take rx back to [facility], and give script to pharmacist. Will not be able to get medication until Thurs 12/13/15. He stated that was fine...John Sloan

## 2016-01-02 ENCOUNTER — Telehealth: Payer: Self-pay | Admitting: *Deleted

## 2016-01-02 DIAGNOSIS — G479 Sleep disorder, unspecified: Secondary | ICD-10-CM

## 2016-01-02 DIAGNOSIS — L989 Disorder of the skin and subcutaneous tissue, unspecified: Secondary | ICD-10-CM

## 2016-01-02 MED ORDER — ACETAMINOPHEN-CODEINE #3 300-30 MG PO TABS: 1.0000 | ORAL_TABLET | Freq: Every evening | ORAL | Status: DC | PRN

## 2016-01-02 NOTE — Telephone Encounter (Signed)
Medication refilled

## 2016-01-02 NOTE — Telephone Encounter (Signed)
Received fax stating mail service transfer request. Pt is wanting to start getting 90 day through mail service on his APAP-CODEINE.../LMB

## 2016-01-02 NOTE — Telephone Encounter (Signed)
Rx faxed to [facility]...[name]

## 2016-01-03 ENCOUNTER — Other Ambulatory Visit: Payer: Self-pay | Admitting: *Deleted

## 2016-01-03 DIAGNOSIS — I10 Essential (primary) hypertension: Secondary | ICD-10-CM

## 2016-01-03 MED ORDER — AMLODIPINE BESYLATE 10 MG PO TABS: 10.0000 mg | ORAL_TABLET | Freq: Every day | ORAL | Status: DC

## 2016-01-03 NOTE — Telephone Encounter (Signed)
RECEIVE CALL PT STATES SHE IS NEEDING REFILL ON HIS AMLODIPINE. SENT [place] TO [facility]...[name]

## 2016-01-18 DIAGNOSIS — C61 Malignant neoplasm of prostate: Secondary | ICD-10-CM | POA: Diagnosis not present

## 2016-01-25 DIAGNOSIS — C61 Malignant neoplasm of prostate: Secondary | ICD-10-CM | POA: Diagnosis not present

## 2016-01-30 ENCOUNTER — Other Ambulatory Visit: Payer: Self-pay | Admitting: *Deleted

## 2016-01-30 MED ORDER — LOVASTATIN 40 MG PO TABS: 40.0000 mg | ORAL_TABLET | Freq: Every day | ORAL | Status: DC

## 2016-01-30 MED ORDER — ROPINIROLE HCL 0.5 MG PO TABS: ORAL_TABLET | ORAL | Status: DC

## 2016-01-31 ENCOUNTER — Other Ambulatory Visit: Payer: Self-pay | Admitting: Family

## 2016-02-01 ENCOUNTER — Telehealth: Payer: Self-pay

## 2016-02-01 NOTE — Telephone Encounter (Signed)
Pt lm on triage  rx for lovastatin sent to [facility] earlier today.   Pt informed of same.

## 2016-02-04 ENCOUNTER — Other Ambulatory Visit: Payer: Self-pay

## 2016-02-04 MED ORDER — LOVASTATIN 40 MG PO TABS: 40.0000 mg | ORAL_TABLET | Freq: Every day | ORAL | Status: DC

## 2016-02-15 ENCOUNTER — Other Ambulatory Visit: Payer: Self-pay | Admitting: Family

## 2016-03-25 ENCOUNTER — Telehealth: Payer: Self-pay | Admitting: *Deleted

## 2016-03-25 DIAGNOSIS — L989 Disorder of the skin and subcutaneous tissue, unspecified: Secondary | ICD-10-CM

## 2016-03-25 DIAGNOSIS — G479 Sleep disorder, unspecified: Secondary | ICD-10-CM

## 2016-03-25 NOTE — Telephone Encounter (Signed)
Rec'd call pt requesting refills on his Tylenol # 3 to be sent to [facility]...John Sloan

## 2016-03-26 MED ORDER — HYDROCHLOROTHIAZIDE 25 MG PO TABS: 25.0000 mg | ORAL_TABLET | Freq: Every day | ORAL | 0 refills | Status: DC

## 2016-03-26 MED ORDER — ACETAMINOPHEN-CODEINE #3 300-30 MG PO TABS: 1.0000 | ORAL_TABLET | Freq: Every evening | ORAL | 0 refills | Status: DC | PRN

## 2016-03-26 NOTE — Telephone Encounter (Signed)
Ok to refill with previous signature. 

## 2016-03-26 NOTE — Telephone Encounter (Signed)
Notified pt refill has been called into [facility],,,/lmb

## 2016-05-12 ENCOUNTER — Other Ambulatory Visit: Payer: Self-pay | Admitting: Family

## 2016-05-17 ENCOUNTER — Other Ambulatory Visit: Payer: Self-pay | Admitting: Family

## 2016-06-13 ENCOUNTER — Other Ambulatory Visit: Payer: Self-pay | Admitting: Family

## 2016-06-18 ENCOUNTER — Other Ambulatory Visit: Payer: Self-pay

## 2016-06-18 DIAGNOSIS — L989 Disorder of the skin and subcutaneous tissue, unspecified: Secondary | ICD-10-CM

## 2016-06-18 DIAGNOSIS — G479 Sleep disorder, unspecified: Secondary | ICD-10-CM

## 2016-06-19 NOTE — Progress Notes (Signed)
Needs office visit been over a year since last office visit.

## 2016-06-21 ENCOUNTER — Other Ambulatory Visit: Payer: Self-pay | Admitting: Family

## 2016-06-25 ENCOUNTER — Other Ambulatory Visit: Payer: Self-pay

## 2016-06-25 DIAGNOSIS — G479 Sleep disorder, unspecified: Secondary | ICD-10-CM

## 2016-06-25 DIAGNOSIS — L989 Disorder of the skin and subcutaneous tissue, unspecified: Secondary | ICD-10-CM

## 2016-06-25 MED ORDER — ACETAMINOPHEN-CODEINE #3 300-30 MG PO TABS: 1.0000 | ORAL_TABLET | Freq: Every evening | ORAL | 0 refills | Status: DC | PRN

## 2016-07-05 ENCOUNTER — Other Ambulatory Visit: Payer: Self-pay | Admitting: Family

## 2016-07-09 ENCOUNTER — Encounter: Payer: Self-pay | Admitting: Family

## 2016-07-09 ENCOUNTER — Ambulatory Visit (INDEPENDENT_AMBULATORY_CARE_PROVIDER_SITE_OTHER): Payer: Medicare Other | Admitting: Family

## 2016-07-09 ENCOUNTER — Other Ambulatory Visit (INDEPENDENT_AMBULATORY_CARE_PROVIDER_SITE_OTHER): Payer: Medicare Other

## 2016-07-09 VITALS — BP 118/78 | HR 63 | Temp 98.0°F | Resp 16 | Ht 68.0 in | Wt 193.0 lb

## 2016-07-09 DIAGNOSIS — E119 Type 2 diabetes mellitus without complications: Secondary | ICD-10-CM | POA: Diagnosis not present

## 2016-07-09 DIAGNOSIS — Z1211 Encounter for screening for malignant neoplasm of colon: Secondary | ICD-10-CM

## 2016-07-09 DIAGNOSIS — C61 Malignant neoplasm of prostate: Secondary | ICD-10-CM

## 2016-07-09 DIAGNOSIS — I10 Essential (primary) hypertension: Secondary | ICD-10-CM

## 2016-07-09 DIAGNOSIS — Z0000 Encounter for general adult medical examination without abnormal findings: Secondary | ICD-10-CM | POA: Diagnosis not present

## 2016-07-09 DIAGNOSIS — Z23 Encounter for immunization: Secondary | ICD-10-CM | POA: Insufficient documentation

## 2016-07-09 LAB — COMPREHENSIVE METABOLIC PANEL
ALT: 36 U/L (ref 0–53)
AST: 31 U/L (ref 0–37)
Albumin: 4.4 g/dL (ref 3.5–5.2)
Alkaline Phosphatase: 41 U/L (ref 39–117)
BILIRUBIN TOTAL: 0.7 mg/dL (ref 0.2–1.2)
BUN: 10 mg/dL (ref 6–23)
CALCIUM: 9.6 mg/dL (ref 8.4–10.5)
CHLORIDE: 96 meq/L (ref 96–112)
CO2: 34 meq/L — AB (ref 19–32)
CREATININE: 1.05 mg/dL (ref 0.40–1.50)
GFR: 91.98 mL/min (ref >60.00)
GLUCOSE: 107 mg/dL — AB (ref 70–99)
Potassium: 3.3 mEq/L — ABNORMAL LOW (ref 3.5–5.1)
Sodium: 137 mEq/L (ref 135–145)
Total Protein: 8.3 g/dL (ref 6.0–8.3)

## 2016-07-09 LAB — HEMOGLOBIN A1C: Hgb A1c MFr Bld: 6.1 % (ref 4.6–6.5)

## 2016-07-09 LAB — MICROALBUMIN / CREATININE URINE RATIO
CREATININE, U: 526.9 mg/dL
Microalb Creat Ratio: 0.6 mg/g (ref 0.0–30.0)
Microalb, Ur: 2.9 mg/dL — ABNORMAL HIGH (ref 0.0–1.9)

## 2016-07-09 LAB — CBC
HCT: 46.8 % (ref 39.0–52.0)
Hemoglobin: 15.8 g/dL (ref 13.0–17.0)
MCHC: 33.7 g/dL (ref 30.0–36.0)
MCV: 88.8 fl (ref 78.0–100.0)
PLATELETS: 365 10*3/uL (ref 150.0–400.0)
RBC: 5.27 Mil/uL (ref 4.22–5.81)
RDW: 15.1 % (ref 11.5–15.5)
WBC: 7.8 10*3/uL (ref 4.0–10.5)

## 2016-07-09 LAB — LIPID PANEL
CHOL/HDL RATIO: 4
Cholesterol: 179 mg/dL (ref 0–200)
HDL: 42.3 mg/dL (ref >39.00)
LDL CALC: 108 mg/dL — AB (ref 0–99)
NONHDL: 136.64
TRIGLYCERIDES: 141 mg/dL (ref 0.0–149.0)
VLDL: 28.2 mg/dL (ref 0.0–40.0)

## 2016-07-09 NOTE — Assessment & Plan Note (Addendum)
1) Anticipatory Guidance: Discussed importance of wearing a seatbelt while driving and not texting while driving; changing batteries in smoke detector at least once annually; wearing suntan lotion when outside; eating a balanced and moderate diet; getting physical activity at least 30 minutes per day.  2) Immunizations / Screenings / Labs:  Tetanus updated today. Declines influenza and Zostavax. Obtain A1c and urine microalbumin for diabetes screening. Due for eye and dental exams encouraged to be completed independently. Due for colon cancer screening with referral to gastroenterology placed. All other screenings are up to date per recommendations. Obtain CBC, CMET, and lipid profile.    Overall well exam with risk factors for cardiovascular disease including hyperlipidemia, hypertension, and type 2 diabetes. Chronic conditions appear adequately managed through medication and lifestyle. He does continue to smoke with a pack year history of approximately 20. He is not ready to quit smoking at this time. Recommend continued follow up with urology for prostate cancer.Continue other healthy lifestyle behaviors and choices.follow-up prevention exam in 1 year. Follow-up office visit for chronic conditions pending blood work.

## 2016-07-09 NOTE — Assessment & Plan Note (Signed)
Appears stable and maintained by urology and oncology. Recommend follow-up with urology for further assessment and treatment if needed. Continue to monitor.

## 2016-07-09 NOTE — Assessment & Plan Note (Signed)
Previous A1c indicating well-controlled type 2 diabetes with no new symptoms of end organ damage. Obtain A1c and urine microalbumin. Currently maintained on atorvastatin for CAD risk reduction. Diabetic foot exam completed today.Encouraged to complete diabetic eye exam independently. Continue with lifestyle management pending A1c results.

## 2016-07-09 NOTE — Assessment & Plan Note (Signed)
Reviewed and updated patient's medical, surgical, family and social history. Medications and allergies were also reviewed. Basic screenings for depression, activities of daily living, hearing, cognition and safety were performed. Provider list was updated and health plan was provided to the patient.  

## 2016-07-09 NOTE — Patient Instructions (Addendum)
Thank you for choosing [facility].  SUMMARY AND INSTRUCTIONS:  Medication:  Please continue to take your medications as prescribed.   Your prescription(s) have been submitted to your pharmacy or been printed and provided for you. Please take as directed and contact our office if you believe you are having problem(s) with the medication(s) or have any questions.  Labs:  Please stop by the lab on the lower level of the building for your blood work. Your results will be released to [place] (or called to you) after review, usually within 72 hours after test completion. If any changes need to be made, you will be notified at that same time.  1.) The lab is open from 7:30am to 5:30 pm Monday-Friday 2.) No appointment is necessary 3.) Fasting (if needed) is 6-8 hours after food and drink; black coffee and water are okay   Follow up:  If your symptoms worsen or fail to improve, please contact our office for further instruction, or in case of emergency go directly to the emergency room at the closest medical facility.   Health Maintenance  Topic Date Due  . FOOT EXAM  04/26/1964  . OPHTHALMOLOGY EXAM  04/26/1964  . HIV Screening  04/26/1969  . TETANUS/TDAP  04/26/1973  . COLONOSCOPY  04/26/2004  . ZOSTAVAX  04/26/2014  . HEMOGLOBIN A1C  06/28/2015  . URINE MICROALBUMIN  09/27/2015  . INFLUENZA VACCINE  11/01/2016 (Originally 03/04/2016)  . PNEUMOCOCCAL POLYSACCHARIDE VACCINE (2) 06/06/2019  . Hepatitis C Screening  Completed     Health Maintenance, Male A healthy lifestyle and preventative care can promote health and wellness.  Maintain regular health, dental, and eye exams.  Eat a healthy diet. Foods like vegetables, fruits, whole grains, low-fat dairy products, and lean protein foods contain the nutrients you need and are low in calories. Decrease your intake of foods high in solid fats, added sugars, and salt. Get information about a proper diet from your health care  provider, if necessary.  Regular physical exercise is one of the most important things you can do for your health. Most adults should get at least 150 minutes of moderate-intensity exercise (any activity that increases your heart rate and causes you to sweat) each week. In addition, most adults need muscle-strengthening exercises on 2 or more days a week.   Maintain a healthy weight. The body mass index (BMI) is a screening tool to identify possible weight problems. It provides an estimate of body fat based on height and weight. Your health care provider can find your BMI and can help you achieve or maintain a healthy weight. For males 20 years and older:  A BMI below 18.5 is considered underweight.  A BMI of 18.5 to 24.9 is normal.  A BMI of 25 to 29.9 is considered overweight.  A BMI of 30 and above is considered obese.  Maintain normal blood lipids and cholesterol by exercising and minimizing your intake of saturated fat. Eat a balanced diet with plenty of fruits and vegetables. Blood tests for lipids and cholesterol should begin at age 87 and be repeated every 5 years. If your lipid or cholesterol levels are high, you are over age 77, or you are at high risk for heart disease, you may need your cholesterol levels checked more frequently.Ongoing high lipid and cholesterol levels should be treated with medicines if diet and exercise are not working.  If you smoke, find out from your health care provider how to quit. If you do not use tobacco,  do not start.  Lung cancer screening is recommended for adults aged 92-80 years who are at high risk for developing lung cancer because of a history of smoking. A yearly low-dose CT scan of the lungs is recommended for people who have at least a 30-pack-year history of smoking and are current smokers or have quit within the past 15 years. A pack year of smoking is smoking an average of 1 pack of cigarettes a day for 1 year (for example, a 30-pack-year  history of smoking could mean smoking 1 pack a day for 30 years or 2 packs a day for 15 years). Yearly screening should continue until the smoker has stopped smoking for at least 15 years. Yearly screening should be stopped for people who develop a health problem that would prevent them from having lung cancer treatment.  If you choose to drink alcohol, do not have more than 2 drinks per day. One drink is considered to be 12 oz (360 mL) of beer, 5 oz (150 mL) of wine, or 1.5 oz (45 mL) of liquor.  Avoid the use of street drugs. Do not share needles with anyone. Ask for help if you need support or instructions about stopping the use of drugs.  High blood pressure causes heart disease and increases the risk of stroke. High blood pressure is more likely to develop in:  People who have blood pressure in the end of the normal range (100-139/85-89 mm Hg).  People who are overweight or obese.  People who are African American.  If you are 38-35 years of age, have your blood pressure checked every 3-5 years. If you are 72 years of age or older, have your blood pressure checked every year. You should have your blood pressure measured twice-once when you are at a hospital or clinic, and once when you are not at a hospital or clinic. Record the average of the two measurements. To check your blood pressure when you are not at a hospital or clinic, you can use:  An automated blood pressure machine at a pharmacy.  A home blood pressure monitor.  If you are 29-68 years old, ask your health care provider if you should take aspirin to prevent heart disease.  Diabetes screening involves taking a blood sample to check your fasting blood sugar level. This should be done once every 3 years after age 45 if you are at a normal weight and without risk factors for diabetes. Testing should be considered at a younger age or be carried out more frequently if you are overweight and have at least 1 risk factor for  diabetes.  Colorectal cancer can be detected and often prevented. Most routine colorectal cancer screening begins at the age of 40 and continues through age 69. However, your health care provider may recommend screening at an earlier age if you have risk factors for colon cancer. On a yearly basis, your health care provider may provide home test kits to check for hidden blood in the stool. A small camera at the end of a tube may be used to directly examine the colon (sigmoidoscopy or colonoscopy) to detect the earliest forms of colorectal cancer. Talk to your health care provider about this at age 106 when routine screening begins. A direct exam of the colon should be repeated every 5-10 years through age 21, unless early forms of precancerous polyps or small growths are found.  People who are at an increased risk for hepatitis B should be screened for this  virus. You are considered at high risk for hepatitis B if:  You were born in a country where hepatitis B occurs often. Talk with your health care provider about which countries are considered high risk.  Your parents were born in a high-risk country and you have not received a shot to protect against hepatitis B (hepatitis B vaccine).  You have HIV or AIDS.  You use needles to inject street drugs.  You live with, or have sex with, someone who has hepatitis B.  You are a man who has sex with other men (MSM).  You get hemodialysis treatment.  You take certain medicines for conditions like cancer, organ transplantation, and autoimmune conditions.  Hepatitis C blood testing is recommended for all people born from [birth year] through 1967 and any individual with known risk factors for hepatitis C.  Healthy men should no longer receive prostate-specific antigen (PSA) blood tests as part of routine cancer screening. Talk to your health care provider about prostate cancer screening.  Testicular cancer screening is not recommended for adolescents or  adult males who have no symptoms. Screening includes self-exam, a health care provider exam, and other screening tests. Consult with your health care provider about any symptoms you have or any concerns you have about testicular cancer.  Practice safe sex. Use condoms and avoid high-risk sexual practices to reduce the spread of sexually transmitted infections (STIs).  You should be screened for STIs, including gonorrhea and chlamydia if:  You are sexually active and are younger than 24 years.  You are older than 24 years, and your health care provider tells you that you are at risk for this type of infection.  Your sexual activity has changed since you were last screened, and you are at an increased risk for chlamydia or gonorrhea. Ask your health care provider if you are at risk.  If you are at risk of being infected with HIV, it is recommended that you take a prescription medicine daily to prevent HIV infection. This is called pre-exposure prophylaxis (PrEP). You are considered at risk if:  You are a man who has sex with other men (MSM).  You are a heterosexual man who is sexually active with multiple partners.  You take drugs by injection.  You are sexually active with a partner who has HIV.  Talk with your health care provider about whether you are at high risk of being infected with HIV. If you choose to begin PrEP, you should first be tested for HIV. You should then be tested every 3 months for as long as you are taking PrEP.  Use sunscreen. Apply sunscreen liberally and repeatedly throughout the day. You should seek shade when your shadow is shorter than you. Protect yourself by wearing long sleeves, pants, a wide-brimmed hat, and sunglasses year round whenever you are outdoors.  Tell your health care provider of new moles or changes in moles, especially if there is a change in shape or color. Also, tell your health care provider if a mole is larger than the size of a pencil  eraser.  A one-time screening for abdominal aortic aneurysm (AAA) and surgical repair of large AAAs by ultrasound is recommended for men aged 11-75 years who are current or former smokers.  Stay current with your vaccines (immunizations). This information is not intended to replace advice given to you by your health care provider. Make sure you discuss any questions you have with your health care provider. Document Released: 01/17/2008 Document Revised: 08/11/2014 Document  Reviewed: 04/24/2015 Elsevier Interactive Patient Education  2017 [place] Directive Advance directives are the legal documents that allow you to make choices about your health care and medical treatment if you cannot speak for yourself. Advance directives are a way for you to communicate your wishes to family, friends, and health care providers. The specified people can then convey your decisions about end-of-life care to avoid confusion if you should become unable to communicate. Ideally, the process of discussing and writing advance directives should happen over time rather than making decisions all at once. Advance directives can be modified as your situation changes, and you can change your mind at any time, even after you have signed the advance directives. Each state has its own laws regarding advance directives. You may want to check with your health care provider, attorney, or state representative about the law in your state. Below are some examples of advance directives. HEALTH CARE PROXY AND DURABLE POWER OF ATTORNEY FOR HEALTH CARE A health care proxy is a person (agent) appointed to make medical decisions for you if you cannot. Generally, people choose someone they know well and trust to represent their preferences when they can no longer do so. You should be sure to ask this person for agreement to act as your agent. An agent may have to exercise judgment in the event of a medical decision for which  your wishes are not known. A durable power of attorney for health care is a legal document that names your health care proxy. Depending on the laws in your state, after the document is written, it may also need to be:  Signed.  Notarized.  Dated.  Copied.  Witnessed.  Incorporated into your medical record. You may also want to appoint someone to manage your financial affairs if you cannot. This is called a durable power of attorney for finances. It is a separate legal document from the durable power of attorney for health care. You may choose the same person or someone different from your health care proxy to act as your agent in financial matters. LIVING WILL A living will is a set of instructions documenting your wishes about medical care when you cannot care for yourself. It is used if you become:  Terminally ill.  Incapacitated.  Unable to communicate.  Unable to make decisions. Items to consider in your living will include:  The use or non-use of life-sustaining equipment, such as dialysis machines and breathing machines (ventilators).  A do not resuscitate (DNR) order, which is the instruction not to use cardiopulmonary resuscitation (CPR) if breathing or heartbeat stops.  Tube feeding.  Withholding of food and fluids.  Comfort (palliative) care when the goal becomes comfort rather than a cure.  Organ and tissue donation. A living will does not give instructions about distribution of your money and property if you should pass away. It is advisable to seek the expert advice of a lawyer in drawing up a will regarding your possessions. Decisions about taxes, beneficiaries, and asset distribution will be legally binding. This process can relieve your family and friends of any burdens surrounding disputes or questions that may come up about the allocation of your assets. DO NOT RESUSCITATE (DNR) A do not resuscitate (DNR) order is a request to not have CPR in the event that  your heart stops beating or you stop breathing. Unless given other instructions, a health care provider will try to help any patient whose heart has stopped or who has  stopped breathing.  This information is not intended to replace advice given to you by your health care provider. Make sure you discuss any questions you have with your health care provider. Document Released: 10/28/2007 Document Revised: 11/12/2015 Document Reviewed: 12/08/2012 Elsevier Interactive Patient Education  2017 [employer].

## 2016-07-09 NOTE — Assessment & Plan Note (Signed)
Blood pressure appears adequately controlled and below goal 140/90 with current regimen and no adverse side effects. Continue current dosage of amlodipine and hydrochlorothiazide. Monitor blood pressure at home and follow sodium diet.

## 2016-07-09 NOTE — Progress Notes (Signed)
Subjective:    Patient ID: John Sloan, male    DOB: 1956/05/17, 60 y.o.   MRN: [id number]  Chief Complaint  Patient presents with  . cpe    fasting    HPI:  John Sloan is a 60 y.o. male who presents today for a Medicare Annual Wellness/Physical exam.    1) Health Maintenance -   Diet - Averages about 2 meals per day consisting of a regular diet; Caffeine intake  is minimal.   Exercise - daily up and down stairs in the house.   2) Preventative Exams / Immunizations:  Dental -- Due for exam  Vision -- Due for exam   Health Maintenance  Topic Date Due  . FOOT EXAM  04/26/1964  . OPHTHALMOLOGY EXAM  04/26/1964  . HIV Screening  04/26/1969  . TETANUS/TDAP  04/26/1973  . COLONOSCOPY  04/26/2004  . ZOSTAVAX  04/26/2014  . HEMOGLOBIN A1C  06/28/2015  . URINE MICROALBUMIN  09/27/2015  . INFLUENZA VACCINE  11/01/2016 (Originally 03/04/2016)  . PNEUMOCOCCAL POLYSACCHARIDE VACCINE (2) 06/06/2019  . Hepatitis C Screening  Completed     Immunization History  Administered Date(s) Administered  . Tdap 07/09/2016    RISK FACTORS  Tobacco History  Smoking Status  . Current Every Day Smoker  . Packs/day: 0.50  . Years: 40.00  . Types: Cigarettes  Smokeless Tobacco  . Never Used     Cardiac risk factors: advanced age (older than 3 for men, 77 for women), diabetes mellitus, dyslipidemia, hypertension, male gender and smoking/ tobacco exposure.  Depression Screen  Depression screen PHQ 2/9 07/09/2016  Decreased Interest 0  Down, Depressed, Hopeless 0  PHQ - 2 Score 0     Activities of Daily Living In your present state of health, do you have any difficulty performing the following activities?:  Driving? No Managing money?  No Feeding yourself? No Getting from bed to chair? No Climbing a flight of stairs? No Preparing food and eating?: No Bathing or showering? No Getting dressed: No Getting to the toilet? No Using the toilet: No Moving around  from place to place: No In the past year have you fallen or had a near fall?:No   Home Safety Has smoke detector and wears seat belts. No excess sun exposure. Are there smokers in your home (other than you)?  No Do you feel safe at home?  Yes  Hearing Difficulties: No Do you often ask people to speak up or repeat themselves? No Do you experience ringing or noises in your ears? No  Do you have difficulty understanding soft or whispered voices? No    Cognitive Testing  Alert? Yes   Normal Appearance? Yes  Oriented to person? Yes  Place? Yes   Time? Yes  Recall of three objects?  Yes  Can perform simple calculations? Yes  Displays appropriate judgment? Yes  Can read the correct time from a watch face? Yes  Do you feel that you have a problem with memory? No  Do you often misplace items? No   Advanced Directives have been discussed with the patient? Yes   Current Physicians/Providers and Suppliers  1. Terri Piedra, FNP - Internal Medicine 2. Arloa Koh, MD - Oncology  Indicate any recent Medical Services you may have received from other than [facility] providers in the past year (date may be approximate).  All answers were reviewed with the patient and necessary referrals were made:  Mauricio Po, [place]   07/09/2016    Allergies  Allergen  Reactions  . Bactrim [Sulfamethoxazole-Trimethoprim] Swelling  . Benazepril Swelling  . Temazepam Swelling    30 mg tablet     Outpatient Medications Prior to Visit  Medication Sig Dispense Refill  . acetaminophen-codeine (TYLENOL #3) 300-30 MG tablet Take 1-2 tablets by mouth at bedtime as needed for moderate pain. Yearly physical due in Oct must have appt for future refills 90 tablet 0  . amLODipine (NORVASC) 10 MG tablet Take 1 tablet (10 mg total) by mouth daily. 90 tablet 3  . aspirin 81 MG tablet Take 81 mg by mouth daily.    . ferrous sulfate 325 (65 FE) MG tablet Take 325 mg by mouth daily with breakfast.     .  hydrochlorothiazide (HYDRODIURIL) 25 MG tablet Take 1 tablet (25 mg total) by mouth daily. Needs office visit for any more refills 30 tablet 0  . lovastatin (MEVACOR) 40 MG tablet TAKE 1 TABLET BY MOUTH  NIGHTLY AT BEDTIME 30 tablet 0  . lovastatin (MEVACOR) 40 MG tablet Take 1 tablet (40 mg total) by mouth at bedtime. Yearly physical due in Oct must see Marya Amsler for refills 90 tablet 0  . lovastatin (MEVACOR) 40 MG tablet Take 1 tablet by mouth  nightly at bedtime 90 tablet 0  . naproxen (NAPROSYN) 500 MG tablet Take 500 mg by mouth as needed.    Marland Kitchen rOPINIRole (REQUIP) 0.5 MG tablet TAKE 1 TABLET BY MOUTH  EVERY MORNING, 1 TABLET AT  MID-DAY, AND 2 TO 3 TABLETS NIGHTLY AT BEDTIME 150 tablet 0   No facility-administered medications prior to visit.      Past Medical History:  Diagnosis Date  . DM (diabetes mellitus) ([place])    "borderline" per pt report--was put on glimeperide beginning of 2015 by [place]CP [place]  . Erectile dysfunction   . Fatty liver    bx done per pt  . History of adenomatous polyp of colon   . History of hepatitis C virus infection    treated 2014--cured per pt (s/p interferon)  . HTN (hypertension)   . Hyperlipemia   . Osteoarthritis    Left knee, right hand, left shoulder--fluctuates with weather.   . Prostate cancer ([place]) 02/2012;07/2015   Gleason 3+3=6: "low volume, low risk --active surveillance per Urol out of state.  PSA 3.7 12/28/13, down slightly from 4.0 on 09/16/13.  Repeat PSA  04/27/14 was 4.66.  On initial visit with new local urologist, Dr. Alinda Money, PSA was 6.19-repeat bx as per protocol was done 09/11/14 and only 2 of 12 cores were pos for prostate adenocarcinoma (gleason score on these 2 was 3+4, slightly upgraded-tx w/rad  . Restless legs syndrome   . S/P radiation therapy 01/31/2015 through 03/28/2015    Prostate 7800 cGy 40 sessions, seminal vesicles 5600 cGy in 40 sessions    .  Tobacco dependence      Past Surgical History:  Procedure Laterality Date  . CIRCUMCISION    . COLONOSCOPY W/ POLYPECTOMY  approx 2012   1 polyp : recall 5 yrs  . PROSTATE BIOPSY  02/18/2012  . PROSTATE BIOPSY N/A 09/11/2014   Disease upgraded s/p bx: pt considering treatments (both in [place] and at his former urologist's in [place].  Procedure: BIOPSY TRANSRECTAL ULTRASONIC PROSTATE (TUBP);  Surgeon: Raynelle Bring, MD;  Location: [facility];  Service: Urology;  Laterality: N/A;     Family History  Problem Relation Age of Onset  . Hypertension Mother   . Hypertension Father   . Prostate cancer  Social History   Social History  . Marital status: Married    Spouse name: N/A  . Number of children: 7  . Years of education: 40   Occupational History  . Disabled    Social History Main Topics  . Smoking status: Current Every Day Smoker    Packs/day: 0.50    Years: 40.00    Types: Cigarettes  . Smokeless tobacco: Never Used  . Alcohol use 7.2 oz/week    12 Cans of beer per week  . Drug use: No     Comment: no marijuania x 5-6 months  . Sexual activity: Not on file   Other Topics Concern  . Not on file   Social History Narrative   Married, 7 children, 22 grandchildren.   Relocated from [place] to [facility] 2015.   Occupation: disabled, former Therapist, occupational.   20 pack-yr tob hx, current as of 03/2014.   Alcohol: drinks about 1 case per week.  Denies probs from his drinking.   Drugs: hx of cocaine and marijuana abuse.  (IV drug use in the past).   Still smokes marijuana occasionally.  No cocaine since 2009.              Review of Systems  Constitutional: Denies fever, chills, fatigue, or significant weight gain/loss. HENT: Head: Denies headache or neck pain Ears: Denies changes in hearing, ringing in ears, earache, drainage Nose: Denies discharge, stuffiness, itching, nosebleed, sinus pain Throat: Denies sore throat, hoarseness, dry mouth, sores, thrush Eyes:  Denies loss/changes in vision, pain, redness, blurry/double vision, flashing lights Cardiovascular: Denies chest pain/discomfort, tightness, palpitations, shortness of breath with activity, difficulty lying down, swelling, sudden awakening with shortness of breath Respiratory: Denies shortness of breath, cough, sputum production, wheezing Gastrointestinal: Denies dysphasia, heartburn, change in appetite, nausea, change in bowel habits, rectal bleeding, constipation, diarrhea, yellow skin or eyes Genitourinary: Denies frequency, urgency, burning/pain, blood in urine, incontinence, change in urinary strength. Musculoskeletal: Denies muscle/joint pain, stiffness, back pain, redness or swelling of joints, trauma Skin: Denies rashes, lumps, itching, dryness, color changes, or hair/nail changes Neurological: Denies dizziness, fainting, seizures, weakness, numbness, tingling, tremor Psychiatric - Denies nervousness, stress, depression or memory loss Endocrine: Denies heat or cold intolerance, sweating, frequent urination, excessive thirst, changes in appetite Hematologic: Denies ease of bruising or bleeding    Objective:     BP 118/78 (BP Location: Left Arm, Patient Position: Sitting, Cuff Size: Large)   Pulse 63   Temp 98 F (36.7 C) (Oral)   Resp 16   Ht 5\' 8"  (1.727 m)   Wt 193 lb (87.5 kg)   SpO2 98%   BMI 29.35 kg/m  Nursing note and vital signs reviewed.  Physical Exam  Constitutional: He is oriented to person, place, and time. He appears well-developed and well-nourished.  HENT:  Head: Normocephalic.  Right Ear: Hearing, tympanic membrane, external ear and ear canal normal.  Left Ear: Hearing, tympanic membrane, external ear and ear canal normal.  Nose: Nose normal.  Mouth/Throat: Uvula is midline, oropharynx is clear and moist and mucous membranes are normal.  Eyes: Conjunctivae and EOM are normal. Pupils are equal, round, and reactive to light.  Neck: Neck supple. No JVD  present. No tracheal deviation present. No thyromegaly present.  Cardiovascular: Normal rate, regular rhythm, normal heart sounds and intact distal pulses.   Pulmonary/Chest: Effort normal and breath sounds normal.  Abdominal: Soft. Bowel sounds are normal. He exhibits no distension and no mass. There is no tenderness. There is  no rebound and no guarding.  Musculoskeletal: Normal range of motion. He exhibits no edema or tenderness.  Lymphadenopathy:    He has no cervical adenopathy.  Neurological: He is alert and oriented to person, place, and time. He has normal reflexes. No cranial nerve deficit. He exhibits normal muscle tone. Coordination normal.  Skin: Skin is warm and dry.  Psychiatric: He has a normal mood and affect. His behavior is normal. Judgment and thought content normal.       Assessment & Plan:   During the course of the visit the patient was educated and counseled about appropriate screening and preventive services including:    Pneumococcal vaccine   Influenza vaccine  Prostate cancer screening  Colorectal cancer screening  Nutrition counseling   Smoking cessation counseling  Diet review for nutrition referral? Yes ____  Not Indicated _X___   Patient Instructions (the written plan) was given to the patient.  Medicare Attestation I have personally reviewed: The patient's medical and social history Their use of alcohol, tobacco or illicit drugs Their current medications and supplements The patient's functional ability including ADLs,fall risks, home safety risks, cognitive, and hearing and visual impairment Diet and physical activities Evidence for depression or mood disorders  The patient's weight, height, BMI,  have been recorded in the chart.  I have made referrals, counseling, and provided education to the patient based on review of the above and I have provided the patient with a written personalized care plan for preventive services.     Problem  List Items Addressed This Visit      Cardiovascular and Mediastinum   HTN (hypertension), benign    Blood pressure appears adequately controlled and below goal 140/90 with current regimen and no adverse side effects. Continue current dosage of amlodipine and hydrochlorothiazide. Monitor blood pressure at home and follow sodium diet.        Endocrine   Type 2 diabetes mellitus, controlled (HCC)    Previous A1c indicating well-controlled type 2 diabetes with no new symptoms of end organ damage. Obtain A1c and urine microalbumin. Currently maintained on atorvastatin for CAD risk reduction. Diabetic foot exam completed today.Encouraged to complete diabetic eye exam independently. Continue with lifestyle management pending A1c results.      Relevant Orders   Hemoglobin A1c   Urine Microalbumin w/creat. ratio     Genitourinary   Prostate cancer ([place])    Appears stable and maintained by urology and oncology. Recommend follow-up with urology for further assessment and treatment if needed. Continue to monitor.        Other   Medicare annual wellness visit, subsequent - Primary    Reviewed and updated patient's medical, surgical, family and social history. Medications and allergies were also reviewed. Basic screenings for depression, activities of daily living, hearing, cognition and safety were performed. Provider list was updated and health plan was provided to the patient.       Need for Tdap vaccination   Relevant Orders   Tdap vaccine greater than or equal to 7yo IM (Completed)   Routine general medical examination at a health care facility    1) Anticipatory Guidance: Discussed importance of wearing a seatbelt while driving and not texting while driving; changing batteries in smoke detector at least once annually; wearing suntan lotion when outside; eating a balanced and moderate diet; getting physical activity at least 30 minutes per day.  2) Immunizations / Screenings / Labs:    Tetanus updated today. Declines influenza and Zostavax. Obtain A1c and  urine microalbumin for diabetes screening. Due for eye and dental exams encouraged to be completed independently. Due for colon cancer screening with referral to gastroenterology placed. All other screenings are up to date per recommendations. Obtain CBC, CMET, and lipid profile.    Overall well exam with risk factors for cardiovascular disease including hyperlipidemia, hypertension, and type 2 diabetes. Chronic conditions appear adequately managed through medication and lifestyle. He does continue to smoke with a pack year history of approximately 20. He is not ready to quit smoking at this time. Recommend continued follow up with urology for prostate cancer.Continue other healthy lifestyle behaviors and choices.follow-up prevention exam in 1 year. Follow-up office visit for chronic conditions pending blood work.      Relevant Orders   CBC   Comprehensive metabolic panel   Lipid panel   Hemoglobin A1c   Urine Microalbumin w/creat. ratio    Other Visit Diagnoses    Colon cancer screening       Relevant Orders   Ambulatory referral to Gastroenterology       I have discontinued Mr. Pinnock's naproxen and rOPINIRole. I am also having him maintain his aspirin, ferrous sulfate, amLODipine, hydrochlorothiazide, acetaminophen-codeine, and lovastatin.   Follow-up: Return in about 3 months (around 10/07/2016), or if symptoms worsen or fail to improve.   Mauricio Po, FNP

## 2016-08-01 DIAGNOSIS — C61 Malignant neoplasm of prostate: Secondary | ICD-10-CM | POA: Diagnosis not present

## 2016-08-08 ENCOUNTER — Other Ambulatory Visit: Payer: Self-pay | Admitting: Family

## 2016-08-08 ENCOUNTER — Other Ambulatory Visit: Payer: Self-pay

## 2016-08-08 DIAGNOSIS — G479 Sleep disorder, unspecified: Secondary | ICD-10-CM

## 2016-08-08 DIAGNOSIS — L989 Disorder of the skin and subcutaneous tissue, unspecified: Secondary | ICD-10-CM

## 2016-08-08 MED ORDER — HYDROCHLOROTHIAZIDE 25 MG PO TABS: 25.0000 mg | ORAL_TABLET | Freq: Every day | ORAL | 1 refills | Status: DC

## 2016-08-08 MED ORDER — ACETAMINOPHEN-CODEINE #3 300-30 MG PO TABS: 1.0000 | ORAL_TABLET | Freq: Every evening | ORAL | 0 refills | Status: DC | PRN

## 2016-08-08 NOTE — Telephone Encounter (Signed)
Faxed script back to [facility].../lmb 

## 2016-08-08 NOTE — Telephone Encounter (Signed)
Last refill was 06/25/16 

## 2016-08-11 DIAGNOSIS — C61 Malignant neoplasm of prostate: Secondary | ICD-10-CM | POA: Diagnosis not present

## 2016-08-19 ENCOUNTER — Other Ambulatory Visit: Payer: Self-pay | Admitting: Family

## 2016-08-19 MED ORDER — ROPINIROLE HCL 0.5 MG PO TABS: ORAL_TABLET | ORAL | 1 refills | Status: DC

## 2016-08-19 MED ORDER — LOVASTATIN 40 MG PO TABS: ORAL_TABLET | ORAL | 0 refills | Status: DC

## 2016-08-19 NOTE — Progress Notes (Signed)
Medications sent to pharmacy

## 2016-08-19 NOTE — Telephone Encounter (Signed)
Routing to greg, I can refill lovastatin, but I am not showing requip on patient's med list, please advise, thanks

## 2016-08-22 ENCOUNTER — Encounter: Payer: Self-pay | Admitting: Family

## 2016-08-22 ENCOUNTER — Other Ambulatory Visit: Payer: Self-pay

## 2016-08-22 DIAGNOSIS — G479 Sleep disorder, unspecified: Secondary | ICD-10-CM

## 2016-08-22 DIAGNOSIS — L989 Disorder of the skin and subcutaneous tissue, unspecified: Secondary | ICD-10-CM

## 2016-08-22 MED ORDER — ACETAMINOPHEN-CODEINE #3 300-30 MG PO TABS: 1.0000 | ORAL_TABLET | Freq: Every evening | ORAL | 0 refills | Status: DC | PRN

## 2016-08-22 NOTE — Telephone Encounter (Signed)
Faxed

## 2016-09-03 ENCOUNTER — Telehealth: Payer: Self-pay

## 2016-09-03 ENCOUNTER — Telehealth: Payer: Self-pay | Admitting: *Deleted

## 2016-09-03 DIAGNOSIS — L989 Disorder of the skin and subcutaneous tissue, unspecified: Secondary | ICD-10-CM

## 2016-09-03 DIAGNOSIS — G479 Sleep disorder, unspecified: Secondary | ICD-10-CM

## 2016-09-03 MED ORDER — ACETAMINOPHEN-CODEINE #3 300-30 MG PO TABS: 1.0000 | ORAL_TABLET | Freq: Every evening | ORAL | 0 refills | Status: DC | PRN

## 2016-09-03 NOTE — Telephone Encounter (Signed)
Rec'd call pt requesting refill on his Tylenol # 3 to be sent to [facility]....lmb

## 2016-09-03 NOTE — Telephone Encounter (Signed)
Medication printed to be faxed.  

## 2016-09-04 NOTE — Telephone Encounter (Signed)
FAXED SCRIPT TO [facility]...John Sloan

## 2016-09-11 ENCOUNTER — Other Ambulatory Visit: Payer: Self-pay | Admitting: Family

## 2016-10-08 ENCOUNTER — Other Ambulatory Visit: Payer: Self-pay

## 2016-10-08 ENCOUNTER — Other Ambulatory Visit: Payer: Self-pay | Admitting: Family

## 2016-10-08 DIAGNOSIS — G479 Sleep disorder, unspecified: Secondary | ICD-10-CM

## 2016-10-08 DIAGNOSIS — L989 Disorder of the skin and subcutaneous tissue, unspecified: Secondary | ICD-10-CM

## 2016-10-08 MED ORDER — ACETAMINOPHEN-CODEINE #3 300-30 MG PO TABS: 1.0000 | ORAL_TABLET | Freq: Every evening | ORAL | 0 refills | Status: DC | PRN

## 2016-10-08 NOTE — Telephone Encounter (Signed)
Last refill was 09/03/16  

## 2016-10-09 NOTE — Telephone Encounter (Signed)
Faxed

## 2016-10-17 ENCOUNTER — Telehealth: Payer: Self-pay | Admitting: *Deleted

## 2016-10-17 NOTE — Telephone Encounter (Signed)
Pt left msg on triage stating he would like rx for some cialis.Marland KitchenJohny Sloan

## 2016-10-19 MED ORDER — TADALAFIL 10 MG PO TABS: 10.0000 mg | ORAL_TABLET | Freq: Every day | ORAL | 0 refills | Status: DC | PRN

## 2016-10-19 NOTE — Telephone Encounter (Signed)
Medication has been sent.  

## 2016-11-27 ENCOUNTER — Telehealth: Payer: Self-pay | Admitting: *Deleted

## 2016-11-27 DIAGNOSIS — L989 Disorder of the skin and subcutaneous tissue, unspecified: Secondary | ICD-10-CM

## 2016-11-27 DIAGNOSIS — I10 Essential (primary) hypertension: Secondary | ICD-10-CM

## 2016-11-27 DIAGNOSIS — G479 Sleep disorder, unspecified: Secondary | ICD-10-CM

## 2016-11-27 MED ORDER — LOVASTATIN 40 MG PO TABS: ORAL_TABLET | ORAL | 0 refills | Status: DC

## 2016-11-27 MED ORDER — AMLODIPINE BESYLATE 10 MG PO TABS: 10.0000 mg | ORAL_TABLET | Freq: Every day | ORAL | 0 refills | Status: DC

## 2016-11-27 MED ORDER — HYDROCHLOROTHIAZIDE 25 MG PO TABS: 25.0000 mg | ORAL_TABLET | Freq: Every day | ORAL | 0 refills | Status: DC

## 2016-11-27 MED ORDER — ACETAMINOPHEN-CODEINE #3 300-30 MG PO TABS: 1.0000 | ORAL_TABLET | Freq: Every evening | ORAL | 0 refills | Status: DC | PRN

## 2016-11-27 NOTE — Telephone Encounter (Signed)
Left msg on trisge requesting refills on his Tylenol # 3, HCTZ, Lovastatin need sent to [facility]. Sent maintenace meds pls advise on control...John Sloan

## 2016-11-27 NOTE — Telephone Encounter (Signed)
Medication refilled

## 2016-11-28 NOTE — Telephone Encounter (Signed)
Controlled medication has been faxed to pharmacy

## 2017-01-28 ENCOUNTER — Ambulatory Visit (INDEPENDENT_AMBULATORY_CARE_PROVIDER_SITE_OTHER): Payer: Medicare Other | Admitting: Family

## 2017-01-28 ENCOUNTER — Encounter: Payer: Self-pay | Admitting: Family

## 2017-01-28 ENCOUNTER — Other Ambulatory Visit (INDEPENDENT_AMBULATORY_CARE_PROVIDER_SITE_OTHER): Payer: Medicare Other

## 2017-01-28 VITALS — BP 128/80 | HR 66 | Temp 98.0°F | Resp 16 | Ht 68.0 in | Wt 195.0 lb

## 2017-01-28 DIAGNOSIS — G479 Sleep disorder, unspecified: Secondary | ICD-10-CM

## 2017-01-28 DIAGNOSIS — I10 Essential (primary) hypertension: Secondary | ICD-10-CM

## 2017-01-28 DIAGNOSIS — G2581 Restless legs syndrome: Secondary | ICD-10-CM

## 2017-01-28 DIAGNOSIS — R7303 Prediabetes: Secondary | ICD-10-CM

## 2017-01-28 LAB — COMPREHENSIVE METABOLIC PANEL
ALBUMIN: 4.3 g/dL (ref 3.5–5.2)
ALT: 61 U/L — AB (ref 0–53)
AST: 50 U/L — AB (ref 0–37)
Alkaline Phosphatase: 38 U/L — ABNORMAL LOW (ref 39–117)
BILIRUBIN TOTAL: 0.7 mg/dL (ref 0.2–1.2)
BUN: 8 mg/dL (ref 6–23)
CALCIUM: 9.6 mg/dL (ref 8.4–10.5)
CO2: 33 mEq/L — ABNORMAL HIGH (ref 19–32)
CREATININE: 1.04 mg/dL (ref 0.40–1.50)
Chloride: 97 mEq/L (ref 96–112)
GFR: 92.84 mL/min (ref >60.00)
Glucose, Bld: 108 mg/dL — ABNORMAL HIGH (ref 70–99)
Potassium: 2.9 mEq/L — ABNORMAL LOW (ref 3.5–5.1)
SODIUM: 135 meq/L (ref 135–145)
Total Protein: 7.5 g/dL (ref 6.0–8.3)

## 2017-01-28 LAB — MICROALBUMIN / CREATININE URINE RATIO
CREATININE, U: 280.3 mg/dL
MICROALB UR: 1.9 mg/dL (ref 0.0–1.9)
Microalb Creat Ratio: 0.7 mg/g (ref 0.0–30.0)

## 2017-01-28 LAB — HEMOGLOBIN A1C: Hgb A1c MFr Bld: 6.1 % (ref 4.6–6.5)

## 2017-01-28 MED ORDER — ACETAMINOPHEN-CODEINE #3 300-30 MG PO TABS: 1.0000 | ORAL_TABLET | Freq: Every evening | ORAL | 0 refills | Status: DC | PRN

## 2017-01-28 MED ORDER — AMLODIPINE BESYLATE 10 MG PO TABS: 10.0000 mg | ORAL_TABLET | Freq: Every day | ORAL | 0 refills | Status: DC

## 2017-01-28 MED ORDER — POTASSIUM CHLORIDE CRYS ER 20 MEQ PO TBCR
20.0000 meq | EXTENDED_RELEASE_TABLET | Freq: Every day | ORAL | 0 refills | Status: DC
Start: 2017-01-28 — End: 2017-03-23

## 2017-01-28 MED ORDER — HYDROCHLOROTHIAZIDE 25 MG PO TABS: 25.0000 mg | ORAL_TABLET | Freq: Every day | ORAL | 0 refills | Status: DC

## 2017-01-28 MED ORDER — ROPINIROLE HCL 3 MG PO TABS: ORAL_TABLET | ORAL | 0 refills | Status: DC

## 2017-01-28 NOTE — Assessment & Plan Note (Signed)
Blood pressure well-controlled and below goal 140/90 with current medication regimen and no adverse side effects. No new symptoms of end organ damage noted on physical exam. Encouraged to monitor blood pressure at home and follow low-sodium diet. Continue current dosage of hydrochlorothiazide and amlodipine.

## 2017-01-28 NOTE — Patient Instructions (Addendum)
Thank you for choosing [facility].  SUMMARY AND INSTRUCTIONS:  Please continue to take your medications as prescribed.  Take the ropinirole 1-2 hours before bedtime. Do not exceed 3 mg per day.  Continue to monitor your blood pressure at home and follow sodium diet.  Follow-up in 3 months or sooner if needed.  Labs:  Please stop by the lab on the lower level of the building for your blood work. Your results will be released to [facility] (or called to you) after review, usually within 72 hours after test completion. If any changes need to be made, you will be notified at that same time.  1.) The lab is open from 7:30am to 5:30 pm Monday-Friday 2.) No appointment is necessary 3.) Fasting (if needed) is 6-8 hours after food and drink; black coffee and water are okay   Follow up:  If your symptoms worsen or fail to improve, please contact our office for further instruction, or in case of emergency go directly to the emergency room at the closest medical facility.

## 2017-01-28 NOTE — Assessment & Plan Note (Signed)
Currently maintained on lifestyle. Obtain hemoglobin A1c to check current status. Continue to monitor pending A1c results.

## 2017-01-28 NOTE — Assessment & Plan Note (Signed)
Restless leg syndrome remains poorly controlled current medication regimen and no adverse side effects with increased symptoms. Increase ropinirole. Discussed proper medication administration side effects. Follow-up if symptoms worsen or do not improve.

## 2017-01-28 NOTE — Progress Notes (Signed)
Subjective:    Patient ID: John Sloan, male    DOB: 1955-11-02, 61 y.o.   MRN: [id number]  Chief Complaint  Patient presents with  . Follow-up    check pre diabetes, hypertension, refill on all meds    HPI:  John Sloan is a 61 y.o. male who  has a past medical history of DM (diabetes mellitus) ([place]); Erectile dysfunction; Fatty liver; History of adenomatous polyp of colon; History of hepatitis C virus infection; HTN (hypertension); Hyperlipemia; Osteoarthritis; Prostate cancer ([place]) (02/2012;07/2015); Restless legs syndrome; S/P radiation therapy (01/31/2015 through 03/28/2015 ); and Tobacco dependence. and presents today for a follow up office visit.   1.) Hypertension - Currently maintained on amlodipine and hydrochlorothiazide. Reports taking the medication as prescribed and denies adverse side effects or hypotensive readings. Denies worse headache of life with noted symptoms of end organ damage. Blood pressures at home have been adequately controlled. Working on following a low-sodium diet.  BP Readings from Last 3 Encounters:  01/28/17 128/80  07/09/16 118/78  05/21/15 (!) 146/80    2.) Restless leg syndrome - Currently maintained on Requip. Reports taking the medication as prescribed and denies adverse side effects. Notes that the medication is not as effective as it once was and his restless leg wakes him up from time to time.                             3.) Prediabetes - Not currently maintained on medication and continues to work on nutrition and physical activity. Denies symptoms of excessive hunger, thirst, or urination.   Allergies  Allergen Reactions  . Bactrim [Sulfamethoxazole-Trimethoprim] Swelling  . Benazepril Swelling  . Temazepam Swelling    30 mg tablet      Outpatient Medications Prior to Visit  Medication Sig Dispense Refill  . aspirin 81 MG tablet Take 81 mg by mouth daily.    . ferrous  sulfate 325 (65 FE) MG tablet Take 325 mg by mouth daily with breakfast.     . lovastatin (MEVACOR) 40 MG tablet TAKE 1 TABLET BY MOUTH  NIGHTLY AT BEDTIME 90 tablet 0  . acetaminophen-codeine (TYLENOL #3) 300-30 MG tablet Take 1-2 tablets by mouth at bedtime as needed for moderate pain. 90 tablet 0  . amLODipine (NORVASC) 10 MG tablet Take 1 tablet (10 mg total) by mouth daily. Follow-up due in June must see Marya Amsler for refills 90 tablet 0  . hydrochlorothiazide (HYDRODIURIL) 25 MG tablet Take 1 tablet (25 mg total) by mouth daily. Follow-up due in June must see Marya Amsler for refills 90 tablet 0  . rOPINIRole (REQUIP) 0.5 MG tablet TAKE 1/2 TABLET BY MOUTH  1-3 HOURS BEFORE BED AND  INCREASE TO 1 TABLET  NIGHTLY 1-3 HOURS BEFORE  BED. 120 tablet 1  . tadalafil (CIALIS) 10 MG tablet Take 1 tablet (10 mg total) by mouth daily as needed for erectile dysfunction. 10 tablet 0   No facility-administered medications prior to visit.       Past Surgical History:  Procedure Laterality Date  . CIRCUMCISION    . COLONOSCOPY W/ POLYPECTOMY  approx 2012   1 polyp : recall 5 yrs  . PROSTATE BIOPSY  02/18/2012  . PROSTATE BIOPSY N/A 09/11/2014   Disease upgraded s/p bx: pt considering treatments (both in [place] and at his former urologist's in [place].  Procedure: BIOPSY TRANSRECTAL ULTRASONIC PROSTATE (TUBP);  Surgeon: Raynelle Bring, MD;  Location: [facility];  Service:  Urology;  Laterality: N/A;      Past Medical History:  Diagnosis Date  . DM (diabetes mellitus) ([place])    "borderline" per pt report--was put on glimeperide beginning of 2015 [place]CP [place]  . Erectile dysfunction   . Fatty liver    bx done per pt  . History of adenomatous polyp of colon   . History of hepatitis C virus infection    treated 2014--cured per pt (s/p interferon)  . HTN (hypertension)   . Hyperlipemia   . Osteoarthritis    Left knee, right hand, left shoulder--fluctuates with weather.   . Prostate cancer ([place])  02/2012;07/2015   Gleason 3+3=6: "low volume, low risk --active surveillance per Urol [place] of state.  PSA 3.7 12/28/13, down slightly from 4.0 on 09/16/13.  Repeat PSA  04/27/14 was 4.66.  On initial visit with new local urologist, Dr. Alinda Money, PSA was 6.19-repeat bx as per protocol was done 09/11/14 and only 2 of 12 cores were pos for prostate adenocarcinoma (gleason score on these 2 was 3+4, slightly upgraded-tx w/rad  . Restless legs syndrome   . S/P radiation therapy 01/31/2015 through 03/28/2015    Prostate 7800 cGy 40 sessions, seminal vesicles 5600 cGy in 40 sessions    . Tobacco dependence       Review of Systems  Constitutional: Negative for chills and fever.  Eyes:       Negative for changes in vision  Respiratory: Negative for cough, chest tightness, shortness of breath and wheezing.   Cardiovascular: Negative for chest pain, palpitations and leg swelling.  Endocrine: Negative for polydipsia, polyphagia and polyuria.  Neurological: Negative for dizziness, weakness, light-headedness and headaches.      Objective:    BP 128/80 (BP Location: Left Arm, Patient Position: Sitting, Cuff Size: Large)   Pulse 66   Temp 98 F (36.7 C) (Oral)   Resp 16   Ht '5\' 8"'$  (1.727 m)   Wt 195 lb (88.5 kg)   SpO2 97%   BMI 29.65 kg/m  Nursing note and vital signs reviewed.  Physical Exam  Constitutional: He is oriented to person, place, and time. He appears well-developed and well-nourished. No distress.  Cardiovascular: Normal rate, regular rhythm, normal heart sounds and intact distal pulses.   Pulmonary/Chest: Effort normal and breath sounds normal.  Neurological: He is alert and oriented to person, place, and time.  Skin: Skin is warm and dry.  Psychiatric: He has a normal mood and affect. His behavior is normal. Judgment and thought content normal.       Assessment & Plan:   Problem List Items Addressed This Visit        Cardiovascular and Mediastinum   HTN (hypertension), benign    Blood pressure well-controlled and below goal 140/90 with current medication regimen and no adverse side effects. No new symptoms of end organ damage noted on physical exam. Encouraged to monitor blood pressure at home and follow low-sodium diet. Continue current dosage of hydrochlorothiazide and amlodipine.      Relevant Medications   amLODipine (NORVASC) 10 MG tablet   hydrochlorothiazide (HYDRODIURIL) 25 MG tablet   Other Relevant Orders   Hemoglobin A1c (Completed)   Comp Met (CMET) (Completed)     Other   Restless leg syndrome, uncontrolled - Primary    Restless leg syndrome remains poorly controlled current medication regimen and no adverse side effects with increased symptoms. Increase ropinirole. Discussed proper medication administration side effects. Follow-up if symptoms worsen or do  not improve.      Prediabetes    Currently maintained on lifestyle. Obtain hemoglobin A1c to check current status. Continue to monitor pending A1c results.      Relevant Orders   Urine Microalbumin w/creat. ratio (Completed)   Sleep disturbance   Relevant Medications   acetaminophen-codeine (TYLENOL #3) 300-30 MG tablet       I have discontinued Mr. Dondlinger's tadalafil. I have also changed his rOPINIRole, amLODipine, and hydrochlorothiazide. Additionally, I am having him maintain his aspirin, ferrous sulfate, lovastatin, and acetaminophen-codeine.   Meds ordered this encounter  Medications  . rOPINIRole (REQUIP) 3 MG tablet    Sig: Take 1 tablet by mouth nightly about 1-2 hours before bed.    Dispense:  90 tablet    Refill:  0    Order Specific Question:   Supervising Provider    Answer:   Pricilla Holm A [[id number]]  . acetaminophen-codeine (TYLENOL #3) 300-30 MG tablet    Sig: Take 1-2 tablets by mouth at bedtime as needed for moderate pain.    Dispense:  90 tablet    Refill:  0    Order Specific Question:    Supervising Provider    Answer:   Pricilla Holm A [[id number]]  . amLODipine (NORVASC) 10 MG tablet    Sig: Take 1 tablet (10 mg total) by mouth daily.    Dispense:  90 tablet    Refill:  0    Order Specific Question:   Supervising Provider    Answer:   Pricilla Holm A [[id number]]  . hydrochlorothiazide (HYDRODIURIL) 25 MG tablet    Sig: Take 1 tablet (25 mg total) by mouth daily.    Dispense:  90 tablet    Refill:  0    Order Specific Question:   Supervising Provider    Answer:   Pricilla Holm A [[id number]]     Follow-up: Return in about 6 months (around 07/30/2017), or if symptoms worsen or fail to improve.  Mauricio Po, FNP

## 2017-02-10 DIAGNOSIS — C61 Malignant neoplasm of prostate: Secondary | ICD-10-CM | POA: Diagnosis not present

## 2017-02-18 DIAGNOSIS — R350 Frequency of micturition: Secondary | ICD-10-CM | POA: Diagnosis not present

## 2017-02-18 DIAGNOSIS — N401 Enlarged prostate with lower urinary tract symptoms: Secondary | ICD-10-CM | POA: Diagnosis not present

## 2017-02-18 DIAGNOSIS — C61 Malignant neoplasm of prostate: Secondary | ICD-10-CM | POA: Diagnosis not present

## 2017-03-18 ENCOUNTER — Other Ambulatory Visit: Payer: Self-pay | Admitting: Family

## 2017-03-23 ENCOUNTER — Telehealth: Payer: Self-pay | Admitting: Family

## 2017-03-23 DIAGNOSIS — G479 Sleep disorder, unspecified: Secondary | ICD-10-CM

## 2017-03-23 MED ORDER — ACETAMINOPHEN-CODEINE #3 300-30 MG PO TABS: 1.0000 | ORAL_TABLET | Freq: Every evening | ORAL | 0 refills | Status: DC | PRN

## 2017-03-23 MED ORDER — LOVASTATIN 40 MG PO TABS: ORAL_TABLET | ORAL | 0 refills | Status: DC

## 2017-03-23 MED ORDER — POTASSIUM CHLORIDE CRYS ER 20 MEQ PO TBCR: 20.0000 meq | EXTENDED_RELEASE_TABLET | Freq: Every day | ORAL | 0 refills | Status: DC

## 2017-03-23 NOTE — Telephone Encounter (Signed)
Faxed script for Tylenol # 3 manually...John Sloan

## 2017-03-23 NOTE — Telephone Encounter (Signed)
Pt called requesting a refill on: lovastatin (MEVACOR) 40 MG tablet  potassium chloride SA (K-DUR,KLOR-CON) 20 MEQ tablet  acetaminophen-codeine (TYLENOL #3) 300-30 MG tablet.  The pt uses [facility].

## 2017-03-23 NOTE — Telephone Encounter (Signed)
NCCSD reviewed with no irregularities. Medication printed to be faxed.

## 2017-03-23 NOTE — Telephone Encounter (Signed)
Sent maintenance meds pls advise on Tylenol #3...John Sloan

## 2017-04-07 ENCOUNTER — Other Ambulatory Visit: Payer: Self-pay | Admitting: Family

## 2017-04-07 DIAGNOSIS — I10 Essential (primary) hypertension: Secondary | ICD-10-CM

## 2017-05-11 ENCOUNTER — Other Ambulatory Visit: Payer: Self-pay | Admitting: Family

## 2017-05-18 ENCOUNTER — Telehealth: Payer: Self-pay | Admitting: Family

## 2017-05-18 NOTE — Telephone Encounter (Signed)
Very sorry, but I do not treat chronic pain in my practice, including the tylenol #3  I am unable to do this, but I can refer to pan management if he wants, or he should consider another provider to transfer care in December

## 2017-05-18 NOTE — Telephone Encounter (Addendum)
Patient requesting refill on tylenol #3.    Patient requesting refill to be [facility].  Will be transferring to [name] in December.

## 2017-05-18 NOTE — Telephone Encounter (Signed)
Pls advise on msg below.../lmb 

## 2017-05-20 NOTE — Telephone Encounter (Signed)
Called patient to offer to transfer to [facility] and see if we could get the Tylenol 3 refilled.  Did explain to patient Schedule II procedures at our office.  Did offer to transfer patient to [facility] and see if we could get refill through her.  Patient disclosed that the Tylenol 3 was not working and he wanted something stronger.  I advised to patient that we could do a referral to pain management for him.  Patient stated he was not interested in this and did not want to continue with transfer.

## 2017-06-05 ENCOUNTER — Encounter: Payer: Self-pay | Admitting: *Deleted

## 2017-06-05 ENCOUNTER — Other Ambulatory Visit: Payer: Self-pay | Admitting: *Deleted

## 2017-06-05 DIAGNOSIS — I10 Essential (primary) hypertension: Secondary | ICD-10-CM

## 2017-06-05 MED ORDER — POTASSIUM CHLORIDE CRYS ER 20 MEQ PO TBCR: EXTENDED_RELEASE_TABLET | ORAL | 0 refills | Status: DC

## 2017-06-05 MED ORDER — ROPINIROLE HCL 3 MG PO TABS: ORAL_TABLET | ORAL | 0 refills | Status: DC

## 2017-06-05 MED ORDER — LOVASTATIN 40 MG PO TABS: ORAL_TABLET | ORAL | 0 refills | Status: DC

## 2017-06-05 MED ORDER — AMLODIPINE BESYLATE 10 MG PO TABS: 10.0000 mg | ORAL_TABLET | Freq: Every day | ORAL | 0 refills | Status: DC

## 2017-06-05 MED ORDER — HYDROCHLOROTHIAZIDE 25 MG PO TABS: 25.0000 mg | ORAL_TABLET | Freq: Every day | ORAL | 0 refills | Status: DC

## 2017-06-05 NOTE — Addendum Note (Signed)
Addended by: Earnstine Regal on: 06/05/2017 03:59 PM   Modules accepted: Orders

## 2017-07-12 ENCOUNTER — Other Ambulatory Visit: Payer: Self-pay | Admitting: Internal Medicine

## 2017-07-15 ENCOUNTER — Other Ambulatory Visit: Payer: Self-pay

## 2017-07-15 MED ORDER — POTASSIUM CHLORIDE CRYS ER 20 MEQ PO TBCR: 20.0000 meq | EXTENDED_RELEASE_TABLET | Freq: Every day | ORAL | 0 refills | Status: DC

## 2017-07-16 ENCOUNTER — Other Ambulatory Visit: Payer: Self-pay | Admitting: *Deleted

## 2017-07-16 DIAGNOSIS — G479 Sleep disorder, unspecified: Secondary | ICD-10-CM

## 2017-07-16 NOTE — Telephone Encounter (Signed)
Rec'd fax pt requesting refill on tylenol # 3 90 day scripts. Denied refill pt must see new provider b4 refill can be renewed. Per chart has appt on 07/30/17 w/Dr. [name]...John Sloan

## 2017-07-30 ENCOUNTER — Other Ambulatory Visit (INDEPENDENT_AMBULATORY_CARE_PROVIDER_SITE_OTHER): Payer: Medicare Other

## 2017-07-30 ENCOUNTER — Ambulatory Visit (INDEPENDENT_AMBULATORY_CARE_PROVIDER_SITE_OTHER): Payer: Medicare Other | Admitting: Internal Medicine

## 2017-07-30 ENCOUNTER — Telehealth: Payer: Self-pay

## 2017-07-30 ENCOUNTER — Ambulatory Visit (INDEPENDENT_AMBULATORY_CARE_PROVIDER_SITE_OTHER)
Admission: RE | Admit: 2017-07-30 | Discharge: 2017-07-30 | Disposition: A | Discharge status: Home / Self Care | Payer: Medicare Other | Source: Ambulatory Visit | Attending: Internal Medicine | Admitting: Internal Medicine

## 2017-07-30 ENCOUNTER — Encounter: Payer: Self-pay | Admitting: Internal Medicine

## 2017-07-30 ENCOUNTER — Other Ambulatory Visit: Payer: Self-pay | Admitting: Internal Medicine

## 2017-07-30 VITALS — BP 138/88 | HR 76 | Temp 97.7°F | Ht 68.0 in | Wt 195.0 lb

## 2017-07-30 DIAGNOSIS — M25521 Pain in right elbow: Secondary | ICD-10-CM

## 2017-07-30 DIAGNOSIS — E785 Hyperlipidemia, unspecified: Secondary | ICD-10-CM

## 2017-07-30 DIAGNOSIS — S59901A Unspecified injury of right elbow, initial encounter: Secondary | ICD-10-CM | POA: Diagnosis not present

## 2017-07-30 DIAGNOSIS — R7303 Prediabetes: Secondary | ICD-10-CM | POA: Diagnosis not present

## 2017-07-30 DIAGNOSIS — S42401A Unspecified fracture of lower end of right humerus, initial encounter for closed fracture: Secondary | ICD-10-CM

## 2017-07-30 DIAGNOSIS — Z0001 Encounter for general adult medical examination with abnormal findings: Secondary | ICD-10-CM

## 2017-07-30 DIAGNOSIS — Z8619 Personal history of other infectious and parasitic diseases: Secondary | ICD-10-CM

## 2017-07-30 DIAGNOSIS — G2581 Restless legs syndrome: Secondary | ICD-10-CM

## 2017-07-30 DIAGNOSIS — I10 Essential (primary) hypertension: Secondary | ICD-10-CM

## 2017-07-30 DIAGNOSIS — E049 Nontoxic goiter, unspecified: Secondary | ICD-10-CM | POA: Diagnosis not present

## 2017-07-30 DIAGNOSIS — Z0000 Encounter for general adult medical examination without abnormal findings: Secondary | ICD-10-CM | POA: Insufficient documentation

## 2017-07-30 LAB — HEPATIC FUNCTION PANEL
ALT: 37 U/L (ref 0–53)
AST: 36 U/L (ref 0–37)
Albumin: 4.4 g/dL (ref 3.5–5.2)
Alkaline Phosphatase: 36 U/L — ABNORMAL LOW (ref 39–117)
BILIRUBIN DIRECT: 0.1 mg/dL (ref 0.0–0.3)
BILIRUBIN TOTAL: 0.8 mg/dL (ref 0.2–1.2)
Total Protein: 7.9 g/dL (ref 6.0–8.3)

## 2017-07-30 LAB — CBC WITH DIFFERENTIAL/PLATELET
Basophils Absolute: 0.1 10*3/uL (ref 0.0–0.1)
Basophils Relative: 1.9 % (ref 0.0–3.0)
EOS ABS: 0.1 10*3/uL (ref 0.0–0.7)
EOS PCT: 1.3 % (ref 0.0–5.0)
HEMATOCRIT: 45.9 % (ref 39.0–52.0)
HEMOGLOBIN: 15.5 g/dL (ref 13.0–17.0)
LYMPHS PCT: 29.3 % (ref 12.0–46.0)
Lymphs Abs: 2.1 10*3/uL (ref 0.7–4.0)
MCHC: 33.8 g/dL (ref 30.0–36.0)
MCV: 91.1 fl (ref 78.0–100.0)
MONO ABS: 0.8 10*3/uL (ref 0.1–1.0)
Monocytes Relative: 10.7 % (ref 3.0–12.0)
Neutro Abs: 4 10*3/uL (ref 1.4–7.7)
Neutrophils Relative %: 56.8 % (ref 43.0–77.0)
Platelets: 321 10*3/uL (ref 150.0–400.0)
RBC: 5.04 Mil/uL (ref 4.22–5.81)
RDW: 14.4 % (ref 11.5–15.5)
WBC: 7.1 10*3/uL (ref 4.0–10.5)

## 2017-07-30 LAB — LIPID PANEL
Cholesterol: 155 mg/dL (ref 0–200)
HDL: 37.9 mg/dL — ABNORMAL LOW (ref >39.00)
LDL Cholesterol: 94 mg/dL (ref 0–99)
NONHDL: 117.11
Total CHOL/HDL Ratio: 4
Triglycerides: 118 mg/dL (ref 0.0–149.0)
VLDL: 23.6 mg/dL (ref 0.0–40.0)

## 2017-07-30 LAB — BASIC METABOLIC PANEL
BUN: 10 mg/dL (ref 6–23)
CALCIUM: 9.3 mg/dL (ref 8.4–10.5)
CO2: 31 mEq/L (ref 19–32)
Chloride: 97 mEq/L (ref 96–112)
Creatinine, Ser: 1.03 mg/dL (ref 0.40–1.50)
GFR: 93.72 mL/min (ref >60.00)
GLUCOSE: 101 mg/dL — AB (ref 70–99)
Potassium: 3.3 mEq/L — ABNORMAL LOW (ref 3.5–5.1)
Sodium: 135 mEq/L (ref 135–145)

## 2017-07-30 LAB — URINALYSIS, ROUTINE W REFLEX MICROSCOPIC
Hgb urine dipstick: NEGATIVE
KETONES UR: NEGATIVE
LEUKOCYTES UA: NEGATIVE
Nitrite: NEGATIVE
RBC / HPF: NONE SEEN (ref 0–?)
SPECIFIC GRAVITY, URINE: 1.025 (ref 1.000–1.030)
Total Protein, Urine: NEGATIVE
Urine Glucose: NEGATIVE
Urobilinogen, UA: 1 (ref 0.0–1.0)
pH: 6 (ref 5.0–8.0)

## 2017-07-30 LAB — TSH: TSH: 0.82 u[IU]/mL (ref 0.35–4.50)

## 2017-07-30 LAB — HEMOGLOBIN A1C: HEMOGLOBIN A1C: 6.1 % (ref 4.6–6.5)

## 2017-07-30 MED ORDER — AMLODIPINE BESYLATE 10 MG PO TABS: 10.0000 mg | ORAL_TABLET | Freq: Every day | ORAL | 3 refills | Status: DC

## 2017-07-30 MED ORDER — HYDROCHLOROTHIAZIDE 25 MG PO TABS: 25.0000 mg | ORAL_TABLET | Freq: Every day | ORAL | 3 refills | Status: DC

## 2017-07-30 MED ORDER — LOVASTATIN 40 MG PO TABS: ORAL_TABLET | ORAL | 3 refills | Status: DC

## 2017-07-30 MED ORDER — FERROUS SULFATE 325 (65 FE) MG PO TABS: 325.0000 mg | ORAL_TABLET | Freq: Every day | ORAL | 3 refills | Status: DC

## 2017-07-30 MED ORDER — ROPINIROLE HCL 0.5 MG PO TABS: ORAL_TABLET | ORAL | 3 refills | Status: DC

## 2017-07-30 MED ORDER — MELOXICAM 15 MG PO TABS: 15.0000 mg | ORAL_TABLET | Freq: Every day | ORAL | 1 refills | Status: DC | PRN

## 2017-07-30 MED ORDER — POTASSIUM CHLORIDE CRYS ER 20 MEQ PO TBCR: 20.0000 meq | EXTENDED_RELEASE_TABLET | Freq: Every day | ORAL | 3 refills | Status: DC

## 2017-07-30 NOTE — Telephone Encounter (Signed)
Called pt, LVM.   CRM created.  

## 2017-07-30 NOTE — Progress Notes (Signed)
Subjective:    Patient ID: John Sloan, male    DOB: Apr 06, 1956, 61 y.o.   MRN: [id number]  HPI  Here for wellness and f/u;  Overall doing ok;  Pt denies Chest pain, worsening SOB, DOE, wheezing, orthopnea, PND, worsening LE edema, palpitations, dizziness or syncope.  Pt denies neurological change such as new headache, facial or extremity weakness.  Pt denies polydipsia, polyuria, or low sugar symptoms. Pt states overall good compliance with treatment and medications, good tolerability, and has been trying to follow appropriate diet.  Pt denies worsening depressive symptoms, suicidal ideation or panic. No fever, night sweats, wt loss, loss of appetite, or other constitutional symptoms.  Pt states good ability with ADL's, has low fall risk, home safety reviewed and adequate, no other significant changes in hearing or vision, and occasionally active with exercise  Declines flu shot.  Wife plans to have him see optho soon.  Sees urology every 6 mo with psa.  Needs 0.5 requip for Noontime as well as current 3 mg at night for breakthrough RLS symptoms.    C/o right elbow pain after fall about 2 wks go, still with mod to severe pain, sore to touch, slight swelling, worse to use the elbow in flexion and extension.  Also, s/p tx for Hep C in [place] about 5 yrs ago, hes not sure if resolved; Denies worsening reflux, abd pain, dysphagia, n/v, bowel change or blood.  Remains on Disability, he thinks due to HTN and fatigue, unable to work due to low stamina, but he is not sure Past Medical History:  Diagnosis Date  . DM (diabetes mellitus) ([place])    "borderline" per pt report--was put on glimeperide beginning of 2015 by previous PCP [place]  . Erectile dysfunction   . Fatty liver    bx done per pt  . History of adenomatous polyp of colon   . History of hepatitis C virus infection    treated 2014--cured per pt (s/p interferon)  . HTN (hypertension)   . Hyperlipemia   . Osteoarthritis    Left knee, right hand, left shoulder--fluctuates with weather.   . Prostate cancer ([place]) 02/2012;07/2015   Gleason 3+3=6: "low volume, low risk --active surveillance per Urol out of state.  PSA 3.7 12/28/13, down slightly from 4.0 on 09/16/13.  Repeat PSA  04/27/14 was 4.66.  On initial visit with new local urologist, Dr. Alinda Money, PSA was 6.19-repeat bx as per protocol was done 09/11/14 and only 2 of 12 cores were pos for prostate adenocarcinoma (gleason score on these 2 was 3+4, slightly upgraded-tx w/rad  . Restless legs syndrome   . S/P radiation therapy 01/31/2015 through 03/28/2015    Prostate 7800 cGy 40 sessions, seminal vesicles 5600 cGy in 40 sessions    . Tobacco dependence    Past Surgical History:  Procedure Laterality Date  . CIRCUMCISION    . COLONOSCOPY W/ POLYPECTOMY  approx 2012   1 polyp : recall 5 yrs  . PROSTATE BIOPSY  02/18/2012  . PROSTATE BIOPSY N/A 09/11/2014   Disease upgraded s/p bx: pt considering treatments (both in [place] and at his former urologist's in [place].  Procedure: BIOPSY TRANSRECTAL ULTRASONIC PROSTATE (TUBP);  Surgeon: Raynelle Bring, MD;  Location: [facility];  Service: Urology;  Laterality: N/A;    reports that he has been smoking cigarettes.  He has a 20.00 pack-year smoking history. he has never used smokeless tobacco. He reports that he drinks about 7.2 oz of alcohol per week. He  reports that he does not use drugs. family history includes Hypertension in his father and mother; Prostate cancer in his unknown relative. Allergies  Allergen Reactions  . Bactrim [Sulfamethoxazole-Trimethoprim] Swelling  . Benazepril Swelling  . Temazepam Swelling    30 mg tablet   Current Outpatient Medications on File Prior to Visit  Medication Sig Dispense Refill  . aspirin 81 MG tablet Take 81 mg by mouth daily.    Marland Kitchen rOPINIRole (REQUIP) 3 MG tablet TAKE 1 TABLET BY MOUTH  NIGHTLY ABOUT 1-2 HOURS  BEFORE  BED. 90 tablet 0   No current facility-administered medications on file prior to visit.    Review of Systems Constitutional: Negative for other unusual diaphoresis, sweats, appetite or weight changes HENT: Negative for other worsening hearing loss, ear pain, facial swelling, mouth sores or neck stiffness.   Eyes: Negative for other worsening pain, redness or other visual disturbance.  Respiratory: Negative for other stridor or swelling Cardiovascular: Negative for other palpitations or other chest pain  Gastrointestinal: Negative for worsening diarrhea or loose stools, blood in stool, distention or other pain Genitourinary: Negative for hematuria, flank pain or other change in urine volume.  Musculoskeletal: Negative for myalgias or other joint swelling.  Skin: Negative for other color change, or other wound or worsening drainage.  Neurological: Negative for other syncope or numbness. Hematological: Negative for other adenopathy or swelling Psychiatric/Behavioral: Negative for hallucinations, other worsening agitation, SI, self-injury, or new decreased concentration All other system neg per pt    Objective:   Physical Exam BP 138/88   Pulse 76   Temp 97.7 F (36.5 C) (Oral)   Ht 5\' 8"  (1.727 m)   Wt 195 lb (88.5 kg)   SpO2 99%   BMI 29.65 kg/m  VS noted,  Constitutional: Pt is oriented to person, place, and time. Appears well-developed and well-nourished, in no significant distress and comfortable Head: Normocephalic and atraumatic  Eyes: Conjunctivae and EOM are normal. Pupils are equal, round, and reactive to light Right Ear: External ear normal without discharge Left Ear: External ear normal without discharge Nose: Nose without discharge or deformity Mouth/Throat: Oropharynx is without other ulcerations and moist  Neck: Normal range of motion. Neck supple. No JVD present. No tracheal deviation present or significant neck LA or mass except for fullness/enlargement to right  thyroid  - Cardiovascular: Normal rate, regular rhythm, normal heart sounds and intact distal pulses.   Pulmonary/Chest: WOB normal and breath sounds without rales or wheezing  Abdominal: Soft. Bowel sounds are normal. NT. No HSM  Musculoskeletal: Normal range of motion. Exhibits no edema, right elbow with tender area right lateral epicondylar region with mild swelling Lymphadenopathy: Has no other cervical adenopathy.  Neurological: Pt is alert and oriented to person, place, and time. Pt has normal reflexes. No cranial nerve deficit. Motor grossly intact, Gait intact Skin: Skin is warm and dry. No rash noted or new ulcerations Psychiatric:  Has normal mood and affect. Behavior is normal without agitation No other exam findings       Assessment & Plan:

## 2017-07-30 NOTE — Patient Instructions (Addendum)
Please call if you change your mind about starting the Losartan 50mg   Please take all new medication as prescribed - the mobic for pain  You can also consider seeing Sports Medicine in this office for the right elbow if not improved, by just calling here for an appt  Ok to add the requip 0.5 mg at [facility], in addition to the 3 mg you take at bedtime  You will be contacted regarding the referral for: thyroid ultrasound, and colonoscopy  Please continue all other medications as before, and refills have been done if requested.  Please have the pharmacy call with any other refills you may need.  Please continue your efforts at being more active, low cholesterol diet, and weight control.  You are otherwise up to date with prevention measures today.  Please keep your appointments with your specialists as you may have planned  Please go to the XRAY Department in the Basement (go straight as you get off the elevator) for the x-ray testing  Please go to the LAB in the Basement (turn left off the elevator) for the tests to be done today  You will be contacted by phone if any changes need to be made immediately.  Otherwise, you will receive a letter about your results with an explanation, but please check with MyChart first.  Please remember to sign up for MyChart if you have not done so, as this will be important to you in the future with finding out test results, communicating by private email, and scheduling acute appointments online when needed.  Please return in 6 months, or sooner if needed

## 2017-07-30 NOTE — Telephone Encounter (Signed)
-----   Message from Biagio Borg, MD sent at 07/30/2017 12:20 PM EST ----- Left message on MyChart, pt to cont same tx except  The test results show that your current treatment is OK, except there is evidence on the xray of a type of small fracture.  I will go ahead and refer you to orthopedic for further consideration.  You should hear from the office soon.    Shirron to please inform pt, I will do referral

## 2017-07-31 ENCOUNTER — Telehealth: Payer: Self-pay

## 2017-07-31 ENCOUNTER — Other Ambulatory Visit: Payer: Self-pay | Admitting: Internal Medicine

## 2017-07-31 DIAGNOSIS — B1920 Unspecified viral hepatitis C without hepatic coma: Secondary | ICD-10-CM

## 2017-07-31 LAB — HEPATITIS C RNA QUANTITATIVE

## 2017-07-31 LAB — EXTRA LAV TOP TUBE

## 2017-07-31 NOTE — Telephone Encounter (Signed)
Pt has been informed and expressed understanding.  

## 2017-08-02 ENCOUNTER — Encounter: Payer: Self-pay | Admitting: Internal Medicine

## 2017-08-02 DIAGNOSIS — M25521 Pain in right elbow: Secondary | ICD-10-CM | POA: Insufficient documentation

## 2017-08-02 NOTE — Assessment & Plan Note (Signed)
Ok to add requip 0.5 at noon to tx breakthrough symptoms, cont 3 mg at bedtime

## 2017-08-02 NOTE — Assessment & Plan Note (Signed)
Asympt,  Lab Results  Component Value Date   HGBA1C 6.1 07/30/2017  ,stable overall by history and exam, recent data reviewed with pt, and pt to continue medical treatment as before,  to f/u any worsening symptoms or concerns'

## 2017-08-02 NOTE — Assessment & Plan Note (Signed)
?   Significance, ok for thyroid u/s r/o nodule

## 2017-08-02 NOTE — Assessment & Plan Note (Signed)
Lab Results  Component Value Date   LDLCALC 94 07/30/2017  stable overall by history and exam, recent data reviewed with pt, and pt to continue medical treatment as before,  to f/u any worsening symptoms or concerns

## 2017-08-02 NOTE — Assessment & Plan Note (Signed)

## 2017-08-02 NOTE — Assessment & Plan Note (Addendum)
?   Epicondylitis vs fx - for mobic prn, check film  In addition to the time spent performing CPE, I spent an additional 25 minutes face to face,in which greater than 50% of this time was spent in counseling and coordination of care for patient's acute illness as documented, including the differential dx, treatment, further evaluation and other management of Right elbow pain, thyroid enlargement, RLS, HTN, HLD, hyperglycemia and hx of Hep C

## 2017-08-02 NOTE — Assessment & Plan Note (Signed)
Not clear about current status, for Hep C Quant RNA

## 2017-08-02 NOTE — Assessment & Plan Note (Signed)
stable overall by history and exam, recent data reviewed with pt, and pt to continue medical treatment as before,  to f/u any worsening symptoms or concerns BP Readings from Last 3 Encounters:  07/30/17 138/88  01/28/17 128/80  07/09/16 118/78

## 2017-08-03 ENCOUNTER — Other Ambulatory Visit: Payer: Medicare Other

## 2017-08-03 DIAGNOSIS — B1920 Unspecified viral hepatitis C without hepatic coma: Secondary | ICD-10-CM

## 2017-08-03 DIAGNOSIS — Z8619 Personal history of other infectious and parasitic diseases: Secondary | ICD-10-CM | POA: Diagnosis not present

## 2017-08-04 LAB — HCV RNA QUANT: HEPATITIS C QUANTITATION: NOT DETECTED [IU]/mL

## 2017-08-06 LAB — HEPATITIS C RNA QUANTITATIVE
HCV QUANT LOG: NOT DETECTED {Log_IU}/mL
HCV RNA, PCR, QN: <15 IU/mL

## 2017-08-07 ENCOUNTER — Telehealth: Payer: Self-pay | Admitting: Internal Medicine

## 2017-08-07 NOTE — Telephone Encounter (Unsigned)
Copied from [place]. Topic: Referral - Status >> Aug 07, 2017  4:41 PM Neva Seat wrote: Pt needs to know the status of the referral for the colonoscopy and who the colonoscopy will be with.  Please call pt back asap.

## 2017-08-07 NOTE — Telephone Encounter (Unsigned)
Copied from [place]. Topic: Referral - Status >> Aug 07, 2017  4:41 PM Neva Seat wrote: Pt needs to know the status of the referral for the colonoscopy and who the colonoscopy will be with.  Please call pt back asap.

## 2017-08-07 NOTE — Telephone Encounter (Signed)
Called [facility] and told them that GI on the 3rd floor has tied tom contact them to set up their appt and I transferred them to that office

## 2017-08-10 ENCOUNTER — Encounter (INDEPENDENT_AMBULATORY_CARE_PROVIDER_SITE_OTHER): Payer: Self-pay | Admitting: Orthopaedic Surgery

## 2017-08-10 ENCOUNTER — Ambulatory Visit (INDEPENDENT_AMBULATORY_CARE_PROVIDER_SITE_OTHER): Payer: Medicare Other | Admitting: Orthopaedic Surgery

## 2017-08-10 DIAGNOSIS — M7711 Lateral epicondylitis, right elbow: Secondary | ICD-10-CM

## 2017-08-10 MED ORDER — BUPIVACAINE HCL 0.25 % IJ SOLN
0.3300 mL | INTRAMUSCULAR | Status: AC | PRN
  Administered 2017-08-10: .33 mL

## 2017-08-10 MED ORDER — LIDOCAINE HCL 1 % IJ SOLN
1.0000 mL | INTRAMUSCULAR | Status: AC | PRN
  Administered 2017-08-10: 1 mL

## 2017-08-10 MED ORDER — METHYLPREDNISOLONE ACETATE 40 MG/ML IJ SUSP
40.0000 mg | INTRAMUSCULAR | Status: AC | PRN
  Administered 2017-08-10: 40 mg

## 2017-08-10 MED ORDER — MELOXICAM 7.5 MG PO TABS: 7.5000 mg | ORAL_TABLET | Freq: Every day | ORAL | 2 refills | Status: DC | PRN

## 2017-08-10 NOTE — Progress Notes (Signed)
Office Visit Note   Patient: John Sloan           Date of Birth: Dec 06, 1955           MRN: [id number] Visit Date: 08/10/2017              Requested by: Biagio Borg, MD [address], [place] [address] PCP: Biagio Borg, MD   Assessment & Plan: Visit Diagnoses:  1. Lateral epicondylitis, right elbow     Plan: Impression is acute traumatic lateral epicondylitis right elbow.  At this point I feel it is appropriate to proceed with an injection to the right lateral epicondyle.  We are also going to provide him with a counterforce strap as well as exercises.  If he is not any better he will call and let us know we will get an MRI to assess his extensor tendons.  Follow-Up Instructions: Return if symptoms worsen or fail to improve.   Orders:  No orders of the defined types were placed in this encounter.  No orders of the defined types were placed in this encounter.     Procedures: Hand/UE Inj: R elbow for lateral epicondylitis on 08/10/2017 9:29 AM Indications: pain Details: 22 G needle Medications: 1 mL lidocaine 1 %; 0.33 mL bupivacaine 0.25 %; 40 mg methylPREDNISolone acetate 40 MG/ML      Clinical Data: No additional findings.   Subjective: Chief Complaint  Patient presents with  . Right Elbow - Pain    HPI this is a pleasant 62 year old gentleman who presents to our clinic today with right elbow pain.  He fell going up a set of stairs this past.  He is unsure whether he fell on the elbow or not as he was a little tipsy at the time.  Since then he has had lateral elbow pain.  This is progressively worsened but he does state that he moved soon after the fall.  Pain is worse with elbow supination and wrist flexion and extension.  No increased pain with gripping.  No weakness.  Of note he was seen in the ED on 07/30/2017 for this problem.  X-rays were obtained.  X-rays of the elbow revealed an old avulsion fracture off of the olecranon.  No acute  fracture to the radial head.  Review of Systems as detailed in HPI.  All others reviewed and are negative.   Objective: Vital Signs: There were no vitals taken for this visit.  Physical Exam well-developed well-nourished gentleman in no acute distress.  Alert and oriented x3.  Ortho Exam examination of the right elbow reveals marked tenderness at the lateral epicondyle.  No tenderness at the radial head.  No tenderness to the radial tunnel.  Pain with supination.  Pain with wrist flexion and extension.  Specialty Comments:  No specialty comments available.  Imaging: No new imaging today.   PMFS History: Patient Active Problem List   Diagnosis Date Noted  . Lateral epicondylitis, right elbow 08/10/2017  . Right elbow pain 08/02/2017  . Encounter for well adult exam with abnormal findings 07/30/2017  . Thyroid enlarged 07/30/2017  . Medicare annual wellness visit, subsequent 07/09/2016  . Need for Tdap vaccination 07/09/2016  . Skin lesion of right leg 05/21/2015  . Sleep disturbance 12/26/2014  . Prediabetes 09/26/2014  . Prostate cancer ([place]) 03/08/2014  . Restless leg syndrome, uncontrolled 03/07/2014  . HTN (hypertension), benign 03/07/2014  . Hyperlipemia 03/07/2014  . History of hepatitis C virus infection 03/07/2014  Past Medical History:  Diagnosis Date  . DM (diabetes mellitus) ([place])    "borderline" per pt report--was put on glimeperide beginning of 2015 [place]  . Erectile dysfunction   . Fatty liver    bx done per pt  . History of adenomatous polyp of colon   . History of hepatitis C virus infection    treated 2014--cured per pt (s/p interferon)  . HTN (hypertension)   . Hyperlipemia   . Osteoarthritis    Left knee, right hand, left shoulder--fluctuates with weather.   . Prostate cancer ([place]) 02/2012;07/2015   Gleason 3+3=6: "low volume, low risk --active surveillance [place]rol [place].  PSA 3.7 12/28/13, down slightly from 4.0 on  09/16/13.  Repeat PSA  04/27/14 was 4.66.  On initial visit with new local urologist, Dr. Alinda Money, PSA was 6.19-repeat bx as per protocol was done 09/11/14 and only 2 of 12 cores were pos for prostate adenocarcinoma (gleason score on these 2 was 3+4, slightly upgraded-tx w/rad  . Restless legs syndrome   . S/P radiation therapy 01/31/2015 through 03/28/2015    Prostate 7800 cGy 40 sessions, seminal vesicles 5600 cGy in 40 sessions    . Tobacco dependence     Family History  Problem Relation Age of Onset  . Hypertension Mother   . Hypertension Father   . Prostate cancer Unknown     Past Surgical History:  Procedure Laterality Date  . CIRCUMCISION    . COLONOSCOPY W/ POLYPECTOMY  approx 2012   1 polyp : recall 5 yrs  . PROSTATE BIOPSY  02/18/2012  . PROSTATE BIOPSY N/A 09/11/2014   Disease upgraded s/p bx: pt considering treatments (both in [place] and at his former urologist's in [place].  Procedure: BIOPSY TRANSRECTAL ULTRASONIC PROSTATE (TUBP);  Surgeon: Raynelle Bring, MD;  Location: [facility];  Service: Urology;  Laterality: N/A;   Social History   Occupational History  . Occupation: Disabled  Tobacco Use  . Smoking status: Current Every Day Smoker    Packs/day: 0.50    Years: 40.00    Pack years: 20.00    Types: Cigarettes  . Smokeless tobacco: Never Used  Substance and Sexual Activity  . Alcohol use: Yes    Alcohol/week: 7.2 oz    Types: 12 Cans of beer per week  . Drug use: No    Comment: no marijuania x 5-6 months  . Sexual activity: Not on file

## 2017-08-18 ENCOUNTER — Encounter: Payer: Self-pay | Admitting: Internal Medicine

## 2017-08-18 ENCOUNTER — Telehealth: Payer: Self-pay | Admitting: Internal Medicine

## 2017-08-18 NOTE — Telephone Encounter (Signed)
Spoke to John Sloan and scheduled his Colonoscopy for 09-01-17. He will come in for a nurse visit on 08-24-17 at 9am.

## 2017-08-20 ENCOUNTER — Ambulatory Visit
Admission: RE | Admit: 2017-08-20 | Discharge: 2017-08-20 | Disposition: A | Discharge status: Home / Self Care | Payer: Medicare Other | Source: Ambulatory Visit | Attending: Internal Medicine | Admitting: Internal Medicine

## 2017-08-20 DIAGNOSIS — E042 Nontoxic multinodular goiter: Secondary | ICD-10-CM | POA: Diagnosis not present

## 2017-08-20 DIAGNOSIS — E049 Nontoxic goiter, unspecified: Secondary | ICD-10-CM

## 2017-08-24 ENCOUNTER — Ambulatory Visit (AMBULATORY_SURGERY_CENTER): Payer: Self-pay | Admitting: *Deleted

## 2017-08-24 ENCOUNTER — Other Ambulatory Visit: Payer: Self-pay

## 2017-08-24 VITALS — Ht 69.0 in | Wt 196.2 lb

## 2017-08-24 DIAGNOSIS — Z86010 Personal history of colonic polyps: Secondary | ICD-10-CM

## 2017-08-24 NOTE — Progress Notes (Signed)
No egg or soy allergy  No anesthesia or intubation problems per pt  No home oxygen used or hx of sleep apnea  Registered in [place]  No diet medications taken

## 2017-08-26 ENCOUNTER — Encounter: Payer: Self-pay | Admitting: Internal Medicine

## 2017-09-01 ENCOUNTER — Encounter: Payer: Self-pay | Admitting: Internal Medicine

## 2017-09-01 ENCOUNTER — Other Ambulatory Visit: Payer: Self-pay

## 2017-09-01 ENCOUNTER — Ambulatory Visit (AMBULATORY_SURGERY_CENTER): Payer: Medicare Other | Admitting: Internal Medicine

## 2017-09-01 VITALS — BP 132/79 | HR 55 | Temp 97.5°F | Resp 9 | Ht 69.0 in | Wt 196.0 lb

## 2017-09-01 DIAGNOSIS — D124 Benign neoplasm of descending colon: Secondary | ICD-10-CM

## 2017-09-01 DIAGNOSIS — Z86010 Personal history of colonic polyps: Secondary | ICD-10-CM | POA: Diagnosis present

## 2017-09-01 DIAGNOSIS — D125 Benign neoplasm of sigmoid colon: Secondary | ICD-10-CM | POA: Diagnosis not present

## 2017-09-01 MED ORDER — SODIUM CHLORIDE 0.9 % IV SOLN
500.0000 mL | Freq: Once | INTRAVENOUS | Status: DC
Start: 2017-09-01 — End: 2018-01-28

## 2017-09-01 NOTE — Op Note (Signed)
[facility] Patient Name: John Sloan Procedure Date: 09/01/2017 10:42 AM MRN: [id number] Endoscopist: Gatha Mayer , MD Age: 62 Referring MD:  Date of Birth: [birth year] Gender: Male Account #: [id number] Procedure:                Colonoscopy Indications:              High risk colon cancer surveillance: Personal                            history of colonic polyps, Last colonoscopy: 2013 Medicines:                Propofol per Anesthesia, Monitored Anesthesia Care Procedure:                Pre-Anesthesia Assessment:                           - Prior to the procedure, a History and Physical                            was performed, and patient medications and                            allergies were reviewed. The patient's tolerance of                            previous anesthesia was also reviewed. The risks                            and benefits of the procedure and the sedation                            options and risks were discussed with the patient.                            All questions were answered, and informed consent                            was obtained. Prior Anticoagulants: The patient has                            taken no previous anticoagulant or antiplatelet                            agents. ASA Grade Assessment: II - A patient with                            mild systemic disease. After reviewing the risks                            and benefits, the patient was deemed in                            satisfactory condition to undergo the procedure.  After obtaining informed consent, the colonoscope                            was passed under direct vision. Throughout the                            procedure, the patient's blood pressure, pulse, and                            oxygen saturations were monitored continuously. The                            Colonoscope was introduced through the anus and             advanced to the the cecum, identified by                            appendiceal orifice and ileocecal valve. The                            colonoscopy was performed without difficulty. The                            patient tolerated the procedure well. The quality                            of the bowel preparation was good. The ileocecal                            valve, appendiceal orifice, and rectum were                            photographed. The bowel preparation used was                            Miralax. Scope In: 10:54:40 AM Scope Out: 11:09:47 AM Scope Withdrawal Time: 0 hours 12 minutes 45 seconds  Total Procedure Duration: 0 hours 15 minutes 7 seconds  Findings:                 The perianal and digital rectal examinations were                            normal.                           Three sessile polyps were found in the sigmoid                            colon and descending colon. The polyps were                            diminutive in size. These polyps were removed with                            a  cold snare. Resection and retrieval were                            complete. Verification of patient identification                            for the specimen was done. Estimated blood loss was                            minimal.                           The exam was otherwise without abnormality on                            direct and retroflexion views. Complications:            No immediate complications. Estimated Blood Loss:     Estimated blood loss was minimal. Impression:               - Three diminutive polyps in the sigmoid colon and                            in the descending colon, removed with a cold snare.                            Resected and retrieved.                           - The examination was otherwise normal on direct                            and retroflexion views.                           - Personal history of colonic  polyps. Per prior                            colonoscopy report - patient does not recall Recommendation:           - Patient has a contact number available for                            emergencies. The signs and symptoms of potential                            delayed complications were discussed with the                            patient. Return to normal activities tomorrow.                            Written discharge instructions were provided to the                            patient.                           -  Continue present medications.                           - Resume previous diet.                           - Repeat colonoscopy is recommended. The                            colonoscopy date will be determined after pathology                            results from today's exam become available for                            review. Gatha Mayer, MD 09/01/2017 11:20:09 AM This report has been signed electronically.

## 2017-09-01 NOTE — Progress Notes (Signed)
Pt's states no medical or surgical changes since previsit or office visit. 

## 2017-09-01 NOTE — Progress Notes (Signed)
Called to room to assist during endoscopic procedure.  Patient ID and intended procedure confirmed with present staff. Received instructions for my participation in the procedure from the performing physician.  

## 2017-09-01 NOTE — Progress Notes (Signed)
Spontaneous respirations throughout. VSS. Resting comfortably. To PACU on room air. Report to  RN. 

## 2017-09-01 NOTE — Patient Instructions (Addendum)
   I found and removed 3 tiny polyps. I will let you know pathology results and when to have another routine colonoscopy by mail and/or My Chart.  I appreciate the opportunity to care for you. Gatha Mayer, MD, [facility]   Information on polyps given.YOU HAD AN ENDOSCOPIC PROCEDURE TODAY AT [facility]:   Refer to the procedure report that was given to you for any specific questions about what was found during the examination.  If the procedure report does not answer your questions, please call your gastroenterologist to clarify.  If you requested that your care partner not be given the details of your procedure findings, then the procedure report has been included in a sealed envelope for you to review at your convenience later.  YOU SHOULD EXPECT: Some feelings of bloating in the abdomen. Passage of more gas than usual.  Walking can help get rid of the air that was put into your GI tract during the procedure and reduce the bloating. If you had a lower endoscopy (such as a colonoscopy or flexible sigmoidoscopy) you may notice spotting of blood in your stool or on the toilet paper. If you underwent a bowel prep for your procedure, you may not have a normal bowel movement for a few days.  Please Note:  You might notice some irritation and congestion in your nose or some drainage.  This is from the oxygen used during your procedure.  There is no need for concern and it should clear up in a day or so.  SYMPTOMS TO REPORT IMMEDIATELY:   Following lower endoscopy (colonoscopy or flexible sigmoidoscopy):  Excessive amounts of blood in the stool  Significant tenderness or worsening of abdominal pains  Swelling of the abdomen that is new, acute  Fever of 100F or higher   For urgent or emergent issues, a gastroenterologist can be reached at any hour by calling [phone].   DIET:  We do recommend a small meal at first, but then you may proceed to your regular diet.  Drink  plenty of fluids but you should avoid alcoholic beverages for 24 hours.  ACTIVITY:  You should plan to take it easy for the rest of today and you should NOT DRIVE or use heavy machinery until tomorrow (because of the sedation medicines used during the test).    FOLLOW UP: Our staff will call the number listed on your records the next business day following your procedure to check on you and address any questions or concerns that you may have regarding the information given to you following your procedure. If we do not reach you, we will leave a message.  However, if you are feeling well and you are not experiencing any problems, there is no need to return our call.  We will assume that you have returned to your regular daily activities without incident.  If any biopsies were taken you will be contacted by phone or by letter within the next 1-3 weeks.  Please call us at [phone] if you have not heard about the biopsies in 3 weeks.    SIGNATURES/CONFIDENTIALITY: You and/or your care partner have signed paperwork which will be entered into your electronic medical record.  These signatures attest to the fact that that the information above on your After Visit Summary has been reviewed and is understood.  Full responsibility of the confidentiality of this discharge information lies with you and/or your care-partner.

## 2017-09-02 ENCOUNTER — Telehealth: Payer: Self-pay | Admitting: *Deleted

## 2017-09-02 DIAGNOSIS — C61 Malignant neoplasm of prostate: Secondary | ICD-10-CM | POA: Diagnosis not present

## 2017-09-02 NOTE — Telephone Encounter (Signed)
  Follow up Call-  Call back number 09/01/2017  Post procedure Call Back phone  # [phone]  Permission to leave phone message Yes  Some recent data might be hidden     Patient questions:  Do you have a fever, pain , or abdominal swelling? No. Pain Score  0 *  Have you tolerated food without any problems? Yes.    Have you been able to return to your normal activities? Yes.    Do you have any questions about your discharge instructions: Diet   No. Medications  No. Follow up visit  No.  Do you have questions or concerns about your Care? No.  Actions: * If pain score is 4 or above: No action needed, pain <4.

## 2017-09-04 ENCOUNTER — Encounter: Payer: Self-pay | Admitting: Internal Medicine

## 2017-09-04 DIAGNOSIS — Z86010 Personal history of colonic polyps: Secondary | ICD-10-CM

## 2017-09-04 HISTORY — DX: Personal history of colonic polyps: Z86.010

## 2017-09-04 NOTE — Progress Notes (Signed)
3 adenomas Recall 2022 My Chart letter

## 2017-09-09 DIAGNOSIS — R350 Frequency of micturition: Secondary | ICD-10-CM | POA: Diagnosis not present

## 2017-09-09 DIAGNOSIS — C61 Malignant neoplasm of prostate: Secondary | ICD-10-CM | POA: Diagnosis not present

## 2017-09-09 DIAGNOSIS — N401 Enlarged prostate with lower urinary tract symptoms: Secondary | ICD-10-CM | POA: Diagnosis not present

## 2017-09-10 ENCOUNTER — Encounter: Payer: Self-pay | Admitting: Internal Medicine

## 2017-11-02 ENCOUNTER — Other Ambulatory Visit: Payer: Self-pay | Admitting: Internal Medicine

## 2017-12-21 ENCOUNTER — Other Ambulatory Visit: Payer: Self-pay | Admitting: Internal Medicine

## 2018-01-28 ENCOUNTER — Ambulatory Visit (INDEPENDENT_AMBULATORY_CARE_PROVIDER_SITE_OTHER): Payer: Medicare Other | Admitting: Internal Medicine

## 2018-01-28 ENCOUNTER — Other Ambulatory Visit (INDEPENDENT_AMBULATORY_CARE_PROVIDER_SITE_OTHER): Payer: Medicare Other

## 2018-01-28 ENCOUNTER — Encounter: Payer: Self-pay | Admitting: Internal Medicine

## 2018-01-28 ENCOUNTER — Other Ambulatory Visit: Payer: Self-pay | Admitting: Internal Medicine

## 2018-01-28 VITALS — BP 124/82 | HR 77 | Temp 97.7°F | Ht 69.0 in | Wt 196.0 lb

## 2018-01-28 DIAGNOSIS — R7303 Prediabetes: Secondary | ICD-10-CM

## 2018-01-28 DIAGNOSIS — I10 Essential (primary) hypertension: Secondary | ICD-10-CM

## 2018-01-28 DIAGNOSIS — E876 Hypokalemia: Secondary | ICD-10-CM

## 2018-01-28 DIAGNOSIS — E785 Hyperlipidemia, unspecified: Secondary | ICD-10-CM | POA: Diagnosis not present

## 2018-01-28 LAB — BASIC METABOLIC PANEL
BUN: 10 mg/dL (ref 6–23)
CALCIUM: 9.5 mg/dL (ref 8.4–10.5)
CHLORIDE: 98 meq/L (ref 96–112)
CO2: 28 meq/L (ref 19–32)
CREATININE: 0.99 mg/dL (ref 0.40–1.50)
GFR: 97.95 mL/min (ref >60.00)
Glucose, Bld: 111 mg/dL — ABNORMAL HIGH (ref 70–99)
Potassium: 3 mEq/L — ABNORMAL LOW (ref 3.5–5.1)
Sodium: 136 mEq/L (ref 135–145)

## 2018-01-28 LAB — HEPATIC FUNCTION PANEL
ALBUMIN: 4.5 g/dL (ref 3.5–5.2)
ALK PHOS: 40 U/L (ref 39–117)
ALT: 66 U/L — ABNORMAL HIGH (ref 0–53)
AST: 69 U/L — AB (ref 0–37)
BILIRUBIN DIRECT: 0.1 mg/dL (ref 0.0–0.3)
Total Bilirubin: 0.6 mg/dL (ref 0.2–1.2)
Total Protein: 8.1 g/dL (ref 6.0–8.3)

## 2018-01-28 LAB — LIPID PANEL
CHOL/HDL RATIO: 4
Cholesterol: 176 mg/dL (ref 0–200)
HDL: 42.5 mg/dL (ref >39.00)
LDL CALC: 111 mg/dL — AB (ref 0–99)
NONHDL: 133.28
TRIGLYCERIDES: 112 mg/dL (ref 0.0–149.0)
VLDL: 22.4 mg/dL (ref 0.0–40.0)

## 2018-01-28 LAB — HEMOGLOBIN A1C: HEMOGLOBIN A1C: 6.2 % (ref 4.6–6.5)

## 2018-01-28 MED ORDER — ROPINIROLE HCL 0.5 MG PO TABS: ORAL_TABLET | ORAL | 3 refills | Status: AC

## 2018-01-28 MED ORDER — AMLODIPINE BESYLATE 10 MG PO TABS: 10.0000 mg | ORAL_TABLET | Freq: Every day | ORAL | 3 refills | Status: AC

## 2018-01-28 MED ORDER — FERROUS SULFATE 325 (65 FE) MG PO TABS: 325.0000 mg | ORAL_TABLET | Freq: Every day | ORAL | 3 refills | Status: AC

## 2018-01-28 MED ORDER — POTASSIUM CHLORIDE CRYS ER 20 MEQ PO TBCR: 40.0000 meq | EXTENDED_RELEASE_TABLET | Freq: Every day | ORAL | 3 refills | Status: DC

## 2018-01-28 MED ORDER — ROPINIROLE HCL 3 MG PO TABS: ORAL_TABLET | ORAL | 3 refills | Status: AC

## 2018-01-28 MED ORDER — HYDROCHLOROTHIAZIDE 25 MG PO TABS: 25.0000 mg | ORAL_TABLET | Freq: Every day | ORAL | 3 refills | Status: AC

## 2018-01-28 MED ORDER — POTASSIUM CHLORIDE CRYS ER 20 MEQ PO TBCR: 20.0000 meq | EXTENDED_RELEASE_TABLET | Freq: Every day | ORAL | 3 refills | Status: DC

## 2018-01-28 MED ORDER — LOVASTATIN 40 MG PO TABS: ORAL_TABLET | ORAL | 3 refills | Status: DC

## 2018-01-28 NOTE — Assessment & Plan Note (Signed)
stable overall by history and exam, recent data reviewed with pt, and pt to continue medical treatment as before,  to f/u any worsening symptoms or concerns Lab Results  Component Value Date   HGBA1C 6.1 07/30/2017  for f/u lab

## 2018-01-28 NOTE — Patient Instructions (Addendum)

## 2018-01-28 NOTE — Assessment & Plan Note (Signed)
stable overall by history and exam, and pt to continue medical treatment as before,  to f/u any worsening symptoms or concerns 

## 2018-01-28 NOTE — Progress Notes (Signed)
Subjective:    Patient ID: John Sloan, male    DOB: [birth year], 62 y.o.   MRN: [id number]  HPI  Here to f/u; overall doing ok,  Pt denies chest pain, increasing sob or doe, wheezing, orthopnea, PND, increased LE swelling, palpitations, dizziness or syncope.  Pt denies new neurological symptoms such as new headache, or facial or extremity weakness or numbness.  Pt denies polydipsia, polyuria, or low sugar episode.  Pt states overall good compliance with meds, mostly trying to follow appropriate diet, with wt overall stable,  but little exercise however.  Plans to do better Wt Readings from Last 3 Encounters:  01/28/18 196 lb (88.9 kg)  09/01/17 196 lb (88.9 kg)  08/24/17 196 lb 3.2 oz (89 kg)   BP Readings from Last 3 Encounters:  01/28/18 124/82  09/01/17 132/79  07/30/17 138/88   Past Medical History:  Diagnosis Date  . DM (diabetes mellitus) ([place])    "no medications taken 08-24-17 borderline" per pt report--was put on glimeperide beginning of 2015 by previous PCP [place] state  . Erectile dysfunction   . Fatty liver    bx done per pt  . History of adenomatous polyp of colon   . History of hepatitis C virus infection    treated 2014--cured per pt (s/p interferon)  . HTN (hypertension)   . Hx of adenomatous colonic polyps 09/04/2017  . Hyperlipemia   . Osteoarthritis    Left knee, right hand, left shoulder--fluctuates with weather.   . Prostate cancer ([place]) 02/2012;07/2015   Gleason 3+3=6: "low volume, low risk --active surveillance per Urol [place] of state.  PSA 3.7 12/28/13, down slightly from 4.0 on 09/16/13.  Repeat PSA  04/27/14 was 4.66.  On initial visit with new local urologist, Dr. Alinda Money, PSA was 6.19-repeat bx as per protocol was done 09/11/14 and only 2 of 12 cores were pos for prostate adenocarcinoma (gleason score on these 2 was 3+4, slightly upgraded-tx w/rad  . Restless legs syndrome   . S/P radiation therapy 01/31/2015 through  03/28/2015    Prostate 7800 cGy 40 sessions, seminal vesicles 5600 cGy in 40 sessions    . Tobacco dependence    Past Surgical History:  Procedure Laterality Date  . CIRCUMCISION    . COLONOSCOPY    . COLONOSCOPY W/ POLYPECTOMY  approx 2012   1 polyp : recall 5 yrs  . PROSTATE BIOPSY  02/18/2012  . PROSTATE BIOPSY N/A 09/11/2014   Disease upgraded s/p bx: pt considering treatments (both in [place] and at his former urologist's in [place].  Procedure: BIOPSY TRANSRECTAL ULTRASONIC PROSTATE (TUBP);  Surgeon: Raynelle Bring, MD;  Location: [facility];  Service: Urology;  Laterality: N/A;    reports that he has been smoking cigarettes.  He has a 20.00 pack-year smoking history. He has never used smokeless tobacco. He reports that he drinks about 7.2 oz of alcohol per week. He reports that he has current or past drug history. Drug: Marijuana. family history includes Hypertension in his father and mother; Prostate cancer in his unknown relative. Allergies  Allergen Reactions  . Bactrim [Sulfamethoxazole-Trimethoprim] Swelling  . Benazepril Swelling  . Temazepam Swelling    30 mg tablet   Current Outpatient Medications on File Prior to Visit  Medication Sig Dispense Refill  . aspirin 81 MG tablet Take 81 mg by mouth daily.     Current Facility-Administered Medications on File Prior to Visit  Medication Dose Route Frequency Provider Last Rate Last Dose  . 0.9 %  sodium chloride infusion  500 mL Intravenous Once Gatha Mayer, MD       Review of Systems  Constitutional: Negative for other unusual diaphoresis or sweats HENT: Negative for ear discharge or swelling Eyes: Negative for other worsening visual disturbances Respiratory: Negative for stridor or other swelling  Gastrointestinal: Negative for worsening distension or other blood Genitourinary: Negative for retention or other urinary change Musculoskeletal: Negative for  other MSK pain or swelling Skin: Negative for color change or other new lesions Neurological: Negative for worsening tremors and other numbness  Psychiatric/Behavioral: Negative for worsening agitation or other fatigue All other system neg per pt    Objective:   Physical Exam BP 124/82   Pulse 77   Temp 97.7 F (36.5 C) (Oral)   Ht 5\' 9"  (1.753 m)   Wt 196 lb (88.9 kg)   SpO2 98%   BMI 28.94 kg/m  VS noted,  Constitutional: Pt appears in NAD HENT: Head: NCAT.  Right Ear: External ear normal.  Left Ear: External ear normal.  Eyes: . Pupils are equal, round, and reactive to light. Conjunctivae and EOM are normal Nose: without d/c or deformity Neck: Neck supple. Gross normal ROM Cardiovascular: Normal rate and regular rhythm.   Pulmonary/Chest: Effort normal and breath sounds without rales or wheezing.  Abd:  Soft, NT, ND, + BS, no organomegaly Neurological: Pt is alert. At baseline orientation, motor grossly intact Skin: Skin is warm. No rashes, other new lesions, no LE edema Psychiatric: Pt behavior is normal without agitation  No other exam findings Lab Results  Component Value Date   WBC 7.1 07/30/2017   HGB 15.5 07/30/2017   HCT 45.9 07/30/2017   PLT 321.0 07/30/2017   GLUCOSE 101 (H) 07/30/2017   CHOL 155 07/30/2017   TRIG 118.0 07/30/2017   HDL 37.90 (L) 07/30/2017   LDLCALC 94 07/30/2017   ALT 37 07/30/2017   AST 36 07/30/2017   NA 135 07/30/2017   K 3.3 (L) 07/30/2017   CL 97 07/30/2017   CREATININE 1.03 07/30/2017   BUN 10 07/30/2017   CO2 31 07/30/2017   TSH 0.82 07/30/2017   PSA 4.66 (H) 05/01/2014   HGBA1C 6.1 07/30/2017   MICROALBUR 1.9 01/28/2017       Assessment & Plan:

## 2018-01-28 NOTE — Assessment & Plan Note (Signed)
stable overall by history and exam, recent data reviewed with pt, and pt to continue medical treatment as before,  to f/u any worsening symptoms or concerns Lab Results  Component Value Date   LDLCALC 94 07/30/2017   For f/u lab

## 2018-01-29 ENCOUNTER — Telehealth: Payer: Self-pay

## 2018-01-29 NOTE — Telephone Encounter (Signed)
Called pt, LVM.   CRM created.  

## 2018-01-29 NOTE — Telephone Encounter (Signed)
-----   Message from Biagio Borg, MD sent at 01/28/2018 12:23 PM EDT ----- Left message on MyChart, pt to cont same tx except  The test results show that your current treatment is OK, except the potassium is still mild low.  We need to ask you take 2 of the potassium pills, and follow up with a repeat blood test in 1 week.  I will send a new prescription, and you should hear from the office as well.Redmond Baseman to please inform pt, I will do rx and order

## 2018-03-09 ENCOUNTER — Telehealth: Payer: Self-pay | Admitting: Internal Medicine

## 2018-03-09 NOTE — Telephone Encounter (Signed)
No need, since the recall only has to do with the COMBINATION medications :  Amlodipine/valsartan and amlodipine/valsartan/hct   The amlodipine itself is not the issue, and he can continue his amlodipine as he is taking it as a separate pill  I believe the [facility] is incorrect if they are saying the amlodipine by itself has been recalled, since this is not the case.  Please let us know if there are any other concerns, of if [facility] refuses to dispense the medication, as we can send it to a local pharmacy if needed

## 2018-03-09 NOTE — Telephone Encounter (Signed)
Pt has been informed and expressed understanding.  

## 2018-03-09 NOTE — Telephone Encounter (Signed)
Copied from [address] [phone]. Topic: General - Other >> Mar 09, 2018 10:40 AM Lennox Solders wrote: Reason for CRM: pt wife is calling and per [facility]  amlodipine was recalled. Pt would like something else send to [facility]

## 2018-04-12 NOTE — Progress Notes (Addendum)
Subjective:   John Sloan is a 62 y.o. male who presents for Medicare Annual/Subsequent preventive examination.  Review of Systems:  No ROS.  Medicare Wellness Visit. Additional risk factors are reflected in the social history.  Cardiac Risk Factors include: advanced age (>31men, >71 women);dyslipidemia;hypertension;male gender Sleep patterns: feels rested on waking, gets up 3-4 times nightly to void and sleeps 8-9 hours nightly.    Home Safety/Smoke Alarms: Feels safe in home. Smoke alarms in place.  Living environment; residence and Firearm Safety: 1-story house/ trailer, no firearms. Lives with wife, no needs for DME, good support system Seat Belt Safety/Bike Helmet: Wears seat belt.   PSA-  Lab Results  Component Value Date   PSA 4.66 (H) 05/01/2014       Objective:    Vitals: BP 130/82   Pulse 70   Resp 17   Ht 5\' 9"  (1.753 m)   Wt 194 lb (88 kg)   SpO2 100%   BMI 28.65 kg/m   Body mass index is 28.65 kg/m.  Advanced Directives 04/13/2018 05/08/2015 12/12/2014 09/05/2014  Does Patient Have a Medical Advance Directive? No No No No  Does patient want to make changes to medical advance directive? - (No Data) - -  Would patient like information on creating a medical advance directive? Yes (ED - Information included in AVS) - No - patient declined information No - patient declined information    Tobacco Social History   Tobacco Use  Smoking Status Current Every Day Smoker  . Packs/day: 0.50  . Years: 40.00  . Pack years: 20.00  . Types: Cigarettes  Smokeless Tobacco Never Used     Ready to quit: No Counseling given: Yes  Past Medical History:  Diagnosis Date  . DM (diabetes mellitus) ([place])    "no medications taken 08-24-17 borderline" per pt report--was put on glimeperide beginning of 2015 by previous PCP [place] of state  . Erectile dysfunction   . Fatty liver    bx done per pt  . History of adenomatous polyp of colon   . History of hepatitis C virus  infection    treated 2014--cured per pt (s/p interferon)  . HTN (hypertension)   . Hx of adenomatous colonic polyps 09/04/2017  . Hyperlipemia   . Osteoarthritis    Left knee, right hand, left shoulder--fluctuates with weather.   . Prostate cancer ([place]) 02/2012;07/2015   Gleason 3+3=6: "low volume, low risk --active surveillance per Urol out of state.  PSA 3.7 12/28/13, down slightly from 4.0 on 09/16/13.  Repeat PSA  04/27/14 was 4.66.  On initial visit with new local urologist, Dr. Alinda Money, PSA was 6.19-repeat bx as per protocol was done 09/11/14 and only 2 of 12 cores were pos for prostate adenocarcinoma (gleason score on these 2 was 3+4, slightly upgraded-tx w/rad  . Restless legs syndrome   . S/P radiation therapy 01/31/2015 through 03/28/2015    Prostate 7800 cGy 40 sessions, seminal vesicles 5600 cGy in 40 sessions    . Tobacco dependence    Past Surgical History:  Procedure Laterality Date  . CIRCUMCISION    . COLONOSCOPY    . COLONOSCOPY W/ POLYPECTOMY  approx 2012   1 polyp : recall 5 yrs  . PROSTATE BIOPSY  02/18/2012  . PROSTATE BIOPSY N/A 09/11/2014   Disease upgraded s/p bx: pt considering treatments (both in [place] and at his former urologist's in [place].  Procedure: BIOPSY TRANSRECTAL ULTRASONIC PROSTATE (TUBP);  Surgeon: Raynelle Bring, MD;  Location: [facility];  Service: Urology;  Laterality: N/A;   Family History  Problem Relation Age of Onset  . Hypertension Mother   . Hypertension Father   . Prostate cancer Unknown   . Colon cancer Neg Hx   . Esophageal cancer Neg Hx   . Rectal cancer Neg Hx   . Stomach cancer Neg Hx    Social History   Socioeconomic History  . Marital status: Married    Spouse name: Not on file  . Number of children: 7  . Years of education: 38  . Highest education level: Not on file  Occupational History  . Occupation: Disabled  Social Needs  . Financial resource strain: Hard    . Food insecurity:    Worry: Never true    Inability: Never true  . Transportation needs:    Medical: No    Non-medical: No  Tobacco Use  . Smoking status: Current Every Day Smoker    Packs/day: 0.50    Years: 40.00    Pack years: 20.00    Types: Cigarettes  . Smokeless tobacco: Never Used  Substance and Sexual Activity  . Alcohol use: Yes    Alcohol/week: 12.0 standard drinks    Types: 12 Cans of beer per week    Comment: drinks beer daily  . Drug use: Yes    Types: Marijuana    Comment: 09/01/17-THIS MORNING USED  . Sexual activity: Yes  Lifestyle  . Physical activity:    Days per week: 0 days    Minutes per session: Not on file  . Stress: To some extent  Relationships  . Social connections:    Talks on phone: More than three times a week    Gets together: More than three times a week    Attends religious service: 1 to 4 times per year    Active member of club or organization: Not on file    Attends meetings of clubs or organizations: Not on file    Relationship status: Married  Other Topics Concern  . Not on file  Social History Narrative   Married, 7 children, 22 grandchildren.   Relocated from [place] to [facility] 2015.   Occupation: disabled, former Therapist, occupational.   20 pack-yr tob hx, current as of 03/2014.   Alcohol: drinks about 1 case per week.  Denies probs from his drinking.   Drugs: hx of cocaine and marijuana abuse.  (IV drug use in the past).   Still smokes marijuana occasionally.  No cocaine since 2009.          Outpatient Encounter Medications as of 04/13/2018  Medication Sig  . amLODipine (NORVASC) 10 MG tablet Take 1 tablet (10 mg total) by mouth daily.  Marland Kitchen aspirin 81 MG tablet Take 81 mg by mouth daily.  . ferrous sulfate 325 (65 FE) MG tablet Take 1 tablet (325 mg total) by mouth daily with breakfast.  . hydrochlorothiazide (HYDRODIURIL) 25 MG tablet Take 1 tablet (25 mg total) by mouth daily.  Marland Kitchen lovastatin (MEVACOR) 40 MG tablet TAKE 1  TABLET BY MOUTH  NIGHTLY AT BEDTIME  . potassium chloride SA (K-DUR,KLOR-CON) 20 MEQ tablet Take 2 tablets (40 mEq total) by mouth daily.  Marland Kitchen rOPINIRole (REQUIP) 0.5 MG tablet One by mouth per day in the AM and Noon  . rOPINIRole (REQUIP) 3 MG tablet 1 tab by mouth at bedtime   No facility-administered encounter medications on file as of 04/13/2018.     Activities of Daily Living In your present state of  health, do you have any difficulty performing the following activities: 04/13/2018  Hearing? N  Vision? N  Difficulty concentrating or making decisions? N  Walking or climbing stairs? N  Dressing or bathing? N  Doing errands, shopping? N  Preparing Food and eating ? N  Using the Toilet? N  In the past six months, have you accidently leaked urine? N  Do you have problems with loss of bowel control? N  Managing your Medications? N  Managing your Finances? N  Housekeeping or managing your Housekeeping? N  Some recent data might be hidden    Patient Care Team: Biagio Borg, MD as PCP - General (Internal Medicine) Raynelle Bring, MD as Consulting Physician (Urology)   Assessment:   This is a routine wellness examination for [place]. Physical assessment deferred to PCP.   Exercise Activities and Dietary recommendations Current Exercise Habits: The patient does not participate in regular exercise at present, Exercise limited by: None identified  Diet (meal preparation, eat out, water intake, caffeinated beverages, dairy products, fruits and vegetables): in general, a "healthy" diet  , well balanced   Reviewed heart healthy and diabetic diet. Encouraged patient to increase daily water and healthy fluid intake.  Goals    . Patient Stated     Maintain current health status.       Fall Risk Fall Risk  04/13/2018 07/30/2017 07/09/2016 05/08/2015 12/12/2014  Falls in the past year? No Yes No No No  Number falls in past yr: - 1 - - -  Injury with Fall? - Yes - - -    Depression  Screen PHQ 2/9 Scores 04/13/2018 07/30/2017 07/09/2016 05/08/2015  PHQ - 2 Score 0 0 0 0  PHQ- 9 Score - 0 - -    Cognitive Function       Ad8 score reviewed for issues:  Issues making decisions: no  Less interest in hobbies / activities: no  Repeats questions, stories (family complaining): no  Trouble using ordinary gadgets (microwave, computer, phone):no  Forgets the month or year: no  Mismanaging finances: no  Remembering appts: no  Daily problems with thinking and/or memory: no Ad8 score is= 0  Immunization History  Administered Date(s) Administered  . Tdap 07/09/2016    Screening Tests Health Maintenance  Topic Date Due  . OPHTHALMOLOGY EXAM  07/04/2016  . URINE MICROALBUMIN  01/28/2018  . INFLUENZA VACCINE  12/01/2018 (Originally 03/04/2018)  . PNEUMOCOCCAL POLYSACCHARIDE VACCINE AGE 81-64 HIGH RISK  04/14/2019 (Originally 04/26/1958)  . FOOT EXAM  07/30/2018  . HEMOGLOBIN A1C  07/30/2018  . COLONOSCOPY  09/01/2020  . TETANUS/TDAP  07/09/2026  . Hepatitis C Screening  Completed  . HIV Screening  Completed       Plan:      Continue doing brain stimulating activities (puzzles, reading, adult coloring books, staying active) to keep memory sharp.   Continue to eat heart healthy diet (full of fruits, vegetables, whole grains, lean protein, water--limit salt, fat, and sugar intake) and increase physical activity as tolerated.   I have personally reviewed and noted the following in the patient's chart:   . Medical and social history . Use of alcohol, tobacco or illicit drugs  . Current medications and supplements . Functional ability and status . Nutritional status . Physical activity . Advanced directives . List of other physicians . Vitals . Screenings to include cognitive, depression, and falls . Referrals and appointments  In addition, I have reviewed and discussed with patient certain preventive protocols, quality metrics,  and best practice  recommendations. A written personalized care plan for preventive services as well as general preventive health recommendations were provided to patient.     Michiel Cowboy, RN  04/13/2018  Medical screening examination/treatment/procedure(s) were performed by non-physician practitioner and as supervising physician I was immediately available for consultation/collaboration. I agree with above. Cathlean Cower, MD

## 2018-04-13 ENCOUNTER — Ambulatory Visit (INDEPENDENT_AMBULATORY_CARE_PROVIDER_SITE_OTHER): Payer: Medicare Other | Admitting: *Deleted

## 2018-04-13 VITALS — BP 130/82 | HR 70 | Resp 17 | Ht 69.0 in | Wt 194.0 lb

## 2018-04-13 DIAGNOSIS — Z0000 Encounter for general adult medical examination without abnormal findings: Secondary | ICD-10-CM | POA: Diagnosis not present

## 2018-04-13 NOTE — Patient Instructions (Addendum)
www.auntbertha.com or down load app on smart phone  Aunt Berenice Primas website lists multiple social resources for individuals such as: food, health, money, house hold goods, transit, medical supplies, job training and legal services.  [facility] in [place], [place] in: [place] Address: [address] [place], [place] [address] Phone: [phone]  John Sloan , Thank you for taking time to come for your Medicare Wellness Visit. I appreciate your ongoing commitment to your health goals. Please review the following plan we discussed and let me know if I can assist you in the future.   These are the goals we discussed: Goals    . Patient Stated     Maintain current health status.       This is a list of the screening recommended for you and due dates:  Health Maintenance  Topic Date Due  . Eye exam for diabetics  07/04/2016  . Urine Protein Check  01/28/2018  . Flu Shot  12/01/2018*  . Pneumococcal vaccine  04/14/2019*  . Complete foot exam   07/30/2018  . Hemoglobin A1C  07/30/2018  . Colon Cancer Screening  09/01/2020  . Tetanus Vaccine  07/09/2026  .  Hepatitis C: One time screening is recommended by Center for Disease Control  (CDC) for  adults born from [birth year] through 1967.   Completed  . HIV Screening  Completed  *Topic was postponed. The date shown is not the original due date.     Health Maintenance, Male A healthy lifestyle and preventive care is important for your health and wellness. Ask your health care provider about what schedule of regular examinations is right for you. What should I know about weight and diet? Eat a Healthy Diet  Eat plenty of vegetables, fruits, whole grains, low-fat dairy products, and lean protein.  Do not eat a lot of foods high in solid fats, added sugars, or salt.  Maintain a Healthy Weight Regular exercise can help you achieve or maintain a healthy weight. You  should:  Do at least 150 minutes of exercise each week. The exercise should increase your heart rate and make you sweat (moderate-intensity exercise).  Do strength-training exercises at least twice a week.  Watch Your Levels of Cholesterol and Blood Lipids  Have your blood tested for lipids and cholesterol every 5 years starting at 64 years of age. If you are at high risk for heart disease, you should start having your blood tested when you are 64 years old. You may need to have your cholesterol levels checked more often if: ? Your lipid or cholesterol levels are high. ? You are older than 64 years of age. ? You are at high risk for heart disease.  What should I know about cancer screening? Many types of cancers can be detected early and may often be prevented. Lung Cancer  You should be screened every year for lung cancer if: ? You are a current smoker who has smoked for at least 30 years. ? You are a former smoker who has quit within the past 15 years.  Talk to your health care provider about your screening options, when you should start screening, and how often you should be screened.  Colorectal Cancer  Routine colorectal cancer screening usually begins at 64 years of age and should be repeated every 5-10 years until you are 64 years old. You may need to be screened more often if early forms of precancerous  polyps or small growths are found. Your health care provider may recommend screening at an earlier age if you have risk factors for colon cancer.  Your health care provider may recommend using home test kits to check for hidden blood in the stool.  A small camera at the end of a tube can be used to examine your colon (sigmoidoscopy or colonoscopy). This checks for the earliest forms of colorectal cancer.  Prostate and Testicular Cancer  Depending on your age and overall health, your health care provider may do certain tests to screen for prostate and testicular cancer.  Talk  to your health care provider about any symptoms or concerns you have about testicular or prostate cancer.  Skin Cancer  Check your skin from head to toe regularly.  Tell your health care provider about any new moles or changes in moles, especially if: ? There is a change in a mole's size, shape, or color. ? You have a mole that is larger than a pencil eraser.  Always use sunscreen. Apply sunscreen liberally and repeat throughout the day.  Protect yourself by wearing long sleeves, pants, a wide-brimmed hat, and sunglasses when outside.  What should I know about heart disease, diabetes, and high blood pressure?  If you are 45-58 years of age, have your blood pressure checked every 3-5 years. If you are 51 years of age or older, have your blood pressure checked every year. You should have your blood pressure measured twice-once when you are at a hospital or clinic, and once when you are not at a hospital or clinic. Record the average of the two measurements. To check your blood pressure when you are not at a hospital or clinic, you can use: ? An automated blood pressure machine at a pharmacy. ? A home blood pressure monitor.  Talk to your health care provider about your target blood pressure.  If you are between 30-23 years old, ask your health care provider if you should take aspirin to prevent heart disease.  Have regular diabetes screenings by checking your fasting blood sugar level. ? If you are at a normal weight and have a low risk for diabetes, have this test once every three years after the age of 27. ? If you are overweight and have a high risk for diabetes, consider being tested at a younger age or more often.  A one-time screening for abdominal aortic aneurysm (AAA) by ultrasound is recommended for men aged 6-75 years who are current or former smokers. What should I know about preventing infection? Hepatitis B If you have a higher risk for hepatitis B, you should be screened  for this virus. Talk with your health care provider to find out if you are at risk for hepatitis B infection. Hepatitis C Blood testing is recommended for:  Everyone born from [birth year] through 1967.  Anyone with known risk factors for hepatitis C.  Sexually Transmitted Diseases (STDs)  You should be screened each year for STDs including gonorrhea and chlamydia if: ? You are sexually active and are younger than 64 years of age. ? You are older than 64 years of age and your health care provider tells you that you are at risk for this type of infection. ? Your sexual activity has changed since you were last screened and you are at an increased risk for chlamydia or gonorrhea. Ask your health care provider if you are at risk.  Talk with your health care provider about whether you are at high  risk of being infected with HIV. Your health care provider may recommend a prescription medicine to help prevent HIV infection.  What else can I do?  Schedule regular health, dental, and eye exams.  Stay current with your vaccines (immunizations).  Do not use any tobacco products, such as cigarettes, chewing tobacco, and e-cigarettes. If you need help quitting, ask your health care provider.  Limit alcohol intake to no more than 2 drinks per day. One drink equals 12 ounces of beer, 5 ounces of Areana Kosanke, or 1 ounces of hard liquor.  Do not use street drugs.  Do not share needles.  Ask your health care provider for help if you need support or information about quitting drugs.  Tell your health care provider if you often feel depressed.  Tell your health care provider if you have ever been abused or do not feel safe at home. This information is not intended to replace advice given to you by your health care provider. Make sure you discuss any questions you have with your health care provider. Document Released: 01/17/2008 Document Revised: 03/19/2016 Document Reviewed: 04/24/2015 Elsevier Interactive  Patient Education  [employer].

## 2018-04-16 DIAGNOSIS — R350 Frequency of micturition: Secondary | ICD-10-CM | POA: Diagnosis not present

## 2018-08-05 ENCOUNTER — Ambulatory Visit (INDEPENDENT_AMBULATORY_CARE_PROVIDER_SITE_OTHER): Payer: Medicare Other | Admitting: Internal Medicine

## 2018-08-05 ENCOUNTER — Other Ambulatory Visit (INDEPENDENT_AMBULATORY_CARE_PROVIDER_SITE_OTHER): Payer: Medicare Other

## 2018-08-05 ENCOUNTER — Encounter: Payer: Self-pay | Admitting: Internal Medicine

## 2018-08-05 VITALS — BP 122/84 | HR 75 | Temp 97.9°F | Ht 69.0 in | Wt 201.0 lb

## 2018-08-05 DIAGNOSIS — G479 Sleep disorder, unspecified: Secondary | ICD-10-CM

## 2018-08-05 DIAGNOSIS — R7303 Prediabetes: Secondary | ICD-10-CM

## 2018-08-05 DIAGNOSIS — I10 Essential (primary) hypertension: Secondary | ICD-10-CM | POA: Diagnosis not present

## 2018-08-05 DIAGNOSIS — Z0000 Encounter for general adult medical examination without abnormal findings: Secondary | ICD-10-CM

## 2018-08-05 LAB — CBC WITH DIFFERENTIAL/PLATELET
Basophils Absolute: 0.2 10*3/uL — ABNORMAL HIGH (ref 0.0–0.1)
Basophils Relative: 2 % (ref 0.0–3.0)
EOS ABS: 0.1 10*3/uL (ref 0.0–0.7)
Eosinophils Relative: 1.6 % (ref 0.0–5.0)
HCT: 47.2 % (ref 39.0–52.0)
Hemoglobin: 16.3 g/dL (ref 13.0–17.0)
Lymphocytes Relative: 29.8 % (ref 12.0–46.0)
Lymphs Abs: 2.3 10*3/uL (ref 0.7–4.0)
MCHC: 34.5 g/dL (ref 30.0–36.0)
MCV: 89.4 fl (ref 78.0–100.0)
Monocytes Absolute: 0.7 10*3/uL (ref 0.1–1.0)
Monocytes Relative: 8.6 % (ref 3.0–12.0)
NEUTROS PCT: 58 % (ref 43.0–77.0)
Neutro Abs: 4.6 10*3/uL (ref 1.4–7.7)
PLATELETS: 316 10*3/uL (ref 150.0–400.0)
RBC: 5.27 Mil/uL (ref 4.22–5.81)
RDW: 14.8 % (ref 11.5–15.5)
WBC: 7.9 10*3/uL (ref 4.0–10.5)

## 2018-08-05 LAB — LIPID PANEL
Cholesterol: 164 mg/dL (ref 0–200)
HDL: 48.5 mg/dL (ref >39.00)
LDL Cholesterol: 89 mg/dL (ref 0–99)
NonHDL: 115.85
Total CHOL/HDL Ratio: 3
Triglycerides: 132 mg/dL (ref 0.0–149.0)
VLDL: 26.4 mg/dL (ref 0.0–40.0)

## 2018-08-05 LAB — HEPATIC FUNCTION PANEL
ALT: 68 U/L — ABNORMAL HIGH (ref 0–53)
AST: 54 U/L — ABNORMAL HIGH (ref 0–37)
Albumin: 4.5 g/dL (ref 3.5–5.2)
Alkaline Phosphatase: 46 U/L (ref 39–117)
Bilirubin, Direct: 0.1 mg/dL (ref 0.0–0.3)
Total Bilirubin: 0.6 mg/dL (ref 0.2–1.2)
Total Protein: 8 g/dL (ref 6.0–8.3)

## 2018-08-05 LAB — TSH: TSH: 1.03 u[IU]/mL (ref 0.35–4.50)

## 2018-08-05 LAB — BASIC METABOLIC PANEL
BUN: 10 mg/dL (ref 6–23)
CHLORIDE: 99 meq/L (ref 96–112)
CO2: 28 meq/L (ref 19–32)
Calcium: 9.8 mg/dL (ref 8.4–10.5)
Creatinine, Ser: 1.05 mg/dL (ref 0.40–1.50)
GFR: 91.37 mL/min (ref >60.00)
Glucose, Bld: 106 mg/dL — ABNORMAL HIGH (ref 70–99)
Potassium: 3.2 mEq/L — ABNORMAL LOW (ref 3.5–5.1)
Sodium: 137 mEq/L (ref 135–145)

## 2018-08-05 LAB — HEMOGLOBIN A1C: Hgb A1c MFr Bld: 6.1 % (ref 4.6–6.5)

## 2018-08-05 LAB — PSA: PSA: 1.02 ng/mL (ref 0.10–4.00)

## 2018-08-05 MED ORDER — ZOLPIDEM TARTRATE ER 12.5 MG PO TBCR: 12.5000 mg | EXTENDED_RELEASE_TABLET | Freq: Every evening | ORAL | 1 refills | Status: AC | PRN

## 2018-08-05 MED ORDER — ATORVASTATIN CALCIUM 40 MG PO TABS: 40.0000 mg | ORAL_TABLET | Freq: Every day | ORAL | 3 refills | Status: AC

## 2018-08-05 MED ORDER — ZOLPIDEM TARTRATE ER 12.5 MG PO TBCR: 12.5000 mg | EXTENDED_RELEASE_TABLET | Freq: Every evening | ORAL | 1 refills | Status: DC | PRN

## 2018-08-05 NOTE — Progress Notes (Signed)
Subjective:    Patient ID: John Sloan, male    DOB: 11/10/1955, 63 y.o.   MRN: [id number]  HPI  Here for wellness and f/u;  Overall doing ok;  Pt denies Chest pain, worsening SOB, DOE, wheezing, orthopnea, PND, worsening LE edema, palpitations, dizziness or syncope.  Pt denies neurological change such as new headache, facial or extremity weakness.  Pt denies polydipsia, polyuria, or low sugar symptoms. Pt states overall good compliance with treatment and medications, good tolerability, and has been trying to follow appropriate diet.  Pt denies worsening depressive symptoms, suicidal ideation or panic. No fever, night sweats, wt loss, loss of appetite, or other constitutional symptoms.  Pt states good ability with ADL's, has low fall risk, home safety reviewed and adequate, no other significant changes in hearing or vision, and only occasionally active with exercise.  Moving back to [place] soon.  Has only about 3-4 hrs sleep per night starting 9 pm, always up after that, trying not to take naps, no real increased stress.   Past Medical History:  Diagnosis Date  . DM (diabetes mellitus) ([place])    "no medications taken 08-24-17 borderline" per pt report--was put on glimeperide beginning of 2015 by previous PCP [place] of state  . Erectile dysfunction   . Fatty liver    bx done per pt  . History of adenomatous polyp of colon   . History of hepatitis C virus infection    treated 2014--cured per pt (s/p interferon)  . HTN (hypertension)   . Hx of adenomatous colonic polyps 09/04/2017  . Hyperlipemia   . Osteoarthritis    Left knee, right hand, left shoulder--fluctuates with weather.   . Prostate cancer ([place]) 02/2012;07/2015   Gleason 3+3=6: "low volume, low risk --active surveillance per Urol out of state.  PSA 3.7 12/28/13, down slightly from 4.0 on 09/16/13.  Repeat PSA  04/27/14 was 4.66.  On initial visit with new local urologist, Dr. Alinda Money, PSA was 6.19-repeat bx as per protocol was done 09/11/14 and only  2 of 12 cores were pos for prostate adenocarcinoma (gleason score on these 2 was 3+4, slightly upgraded-tx w/rad  . Restless legs syndrome   . S/P radiation therapy 01/31/2015 through 03/28/2015    Prostate 7800 cGy 40 sessions, seminal vesicles 5600 cGy in 40 sessions    . Tobacco dependence    Past Surgical History:  Procedure Laterality Date  . CIRCUMCISION    . COLONOSCOPY    . COLONOSCOPY W/ POLYPECTOMY  approx 2012   1 polyp : recall 5 yrs  . PROSTATE BIOPSY  02/18/2012  . PROSTATE BIOPSY N/A 09/11/2014   Disease upgraded s/p bx: pt considering treatments (both in [place] and at his former urologist's in [place].  Procedure: BIOPSY TRANSRECTAL ULTRASONIC PROSTATE (TUBP);  Surgeon: Raynelle Bring, MD;  Location: [facility];  Service: Urology;  Laterality: N/A;    reports that he has been smoking cigarettes. He has a 20.00 pack-year smoking history. He has never used smokeless tobacco. He reports current alcohol use of about 12.0 standard drinks of alcohol per week. He reports current drug use. Drug: Marijuana. family history includes Hypertension in his father and mother; Prostate cancer in his unknown relative. Allergies  Allergen Reactions  . Bactrim [Sulfamethoxazole-Trimethoprim] Swelling  . Benazepril Swelling  . Temazepam Swelling    30 mg tablet   Current Outpatient Medications on File Prior to Visit  Medication Sig Dispense Refill  . amLODipine (NORVASC) 10 MG tablet Take 1 tablet (10 mg  total) by mouth daily. 90 tablet 3  . aspirin 81 MG tablet Take 81 mg by mouth daily.    . ferrous sulfate 325 (65 FE) MG tablet Take 1 tablet (325 mg total) by mouth daily with breakfast. 90 tablet 3  . hydrochlorothiazide (HYDRODIURIL) 25 MG tablet Take 1 tablet (25 mg total) by mouth daily. 90 tablet 3  . potassium chloride SA (K-DUR,KLOR-CON) 20 MEQ tablet Take 2 tablets (40 mEq total) by mouth daily. 180 tablet 3  .  rOPINIRole (REQUIP) 0.5 MG tablet One by mouth per day in the AM and Noon 180 tablet 3  . rOPINIRole (REQUIP) 3 MG tablet 1 tab by mouth at bedtime 90 tablet 3   No current facility-administered medications on file prior to visit.    Review of Systems Constitutional: Negative for other unusual diaphoresis, sweats, appetite or weight changes HENT: Negative for other worsening hearing loss, ear pain, facial swelling, mouth sores or neck stiffness.   Eyes: Negative for other worsening pain, redness or other visual disturbance.  Respiratory: Negative for other stridor or swelling Cardiovascular: Negative for other palpitations or other chest pain  Gastrointestinal: Negative for worsening diarrhea or loose stools, blood in stool, distention or other pain Genitourinary: Negative for hematuria, flank pain or other change in urine volume.  Musculoskeletal: Negative for myalgias or other joint swelling.  Skin: Negative for other color change, or other wound or worsening drainage.  Neurological: Negative for other syncope or numbness. Hematological: Negative for other adenopathy or swelling Psychiatric/Behavioral: Negative for hallucinations, other worsening agitation, SI, self-injury, or new decreased concentration Al;l other system neg per pt    Objective:   Physical Exam BP 122/84   Pulse 75   Temp 97.9 F (36.6 C) (Oral)   Ht 5\' 9"  (1.753 m)   Wt 201 lb (91.2 kg)   SpO2 95%   BMI 29.68 kg/m  VS noted,  Constitutional: Pt is oriented to person, place, and time. Appears well-developed and well-nourished, in no significant distress and comfortable Head: Normocephalic and atraumatic  Eyes: Conjunctivae and EOM are normal. Pupils are equal, round, and reactive to light Right Ear: External ear normal without discharge Left Ear: External ear normal without discharge Nose: Nose without discharge or deformity Mouth/Throat: Oropharynx is without other ulcerations and moist  Neck: Normal range  of motion. Neck supple. No JVD present. No tracheal deviation present or significant neck LA or mass Cardiovascular: Normal rate, regular rhythm, normal heart sounds and intact distal pulses.   Pulmonary/Chest: WOB normal and breath sounds without rales or wheezing  Abdominal: Soft. Bowel sounds are normal. NT. No HSM  Musculoskeletal: Normal range of motion. Exhibits no edema Lymphadenopathy: Has no other cervical adenopathy.  Neurological: Pt is alert and oriented to person, place, and time. Pt has normal reflexes. No cranial nerve deficit. Motor grossly intact, Gait intact Skin: Skin is warm and dry. No rash noted or new ulcerations Psychiatric:  Has normal mood and affect. Behavior is normal without agitation No other exam findings Lab Results  Component Value Date   WBC 7.1 07/30/2017   HGB 15.5 07/30/2017   HCT 45.9 07/30/2017   PLT 321.0 07/30/2017   GLUCOSE 111 (H) 01/28/2018   CHOL 176 01/28/2018   TRIG 112.0 01/28/2018   HDL 42.50 01/28/2018   LDLCALC 111 (H) 01/28/2018   ALT 66 (H) 01/28/2018   AST 69 (H) 01/28/2018   NA 136 01/28/2018   K 3.0 (L) 01/28/2018   CL 98 01/28/2018  CREATININE 0.99 01/28/2018   BUN 10 01/28/2018   CO2 28 01/28/2018   TSH 0.82 07/30/2017   PSA 4.66 (H) 05/01/2014   HGBA1C 6.2 01/28/2018   MICROALBUR 1.9 01/28/2017          Assessment & Plan:

## 2018-08-05 NOTE — Patient Instructions (Addendum)
Ok to change the lovastatin to lipitor 40 mg per day  Please take all new medication as prescribed - the ambien for sleep as needed  Please continue all other medications as before, and refills have been done if requested.  Please have the pharmacy call with any other refills you may need.  Please continue your efforts at being more active, low cholesterol diet, and weight control.  You are otherwise up to date with prevention measures today.  Please keep your appointments with your specialists as you may have planned  Please go to the LAB in the Basement (turn left off the elevator) for the tests to be done today  You will be contacted by phone if any changes need to be made immediately.  Otherwise, you will receive a letter about your results with an explanation, but please check with MyChart first.  Please remember to sign up for MyChart if you have not done so, as this will be important to you in the future with finding out test results, communicating by private email, and scheduling acute appointments online when needed.  Please return in 6 months, or sooner if needed, with Lab testing done 3-5 days before

## 2018-08-05 NOTE — Assessment & Plan Note (Signed)
Ok for ambien qhs prn 

## 2018-08-05 NOTE — Assessment & Plan Note (Signed)
stable overall by history and exam, recent data reviewed with pt, and pt to continue medical treatment as before,  to f/u any worsening symptoms or concerns  

## 2018-08-05 NOTE — Assessment & Plan Note (Signed)
For a1c with labs

## 2018-08-05 NOTE — Assessment & Plan Note (Signed)

## 2019-03-29 ENCOUNTER — Other Ambulatory Visit: Payer: Self-pay | Admitting: Internal Medicine

## 2019-04-21 IMAGING — US US THYROID
1 series · 13 of 25 positions shown · non-contrast
Comparison: None available

CLINICAL DATA: Thyromegaly.

EXAM:
THYROID ULTRASOUND
TECHNIQUE: Ultrasound examination of the thyroid gland and adjacent soft
tissues was performed.

[Series 1: us thyroid · 0.06mm/px · 13 of 45 slices shown]
[im 1/45]
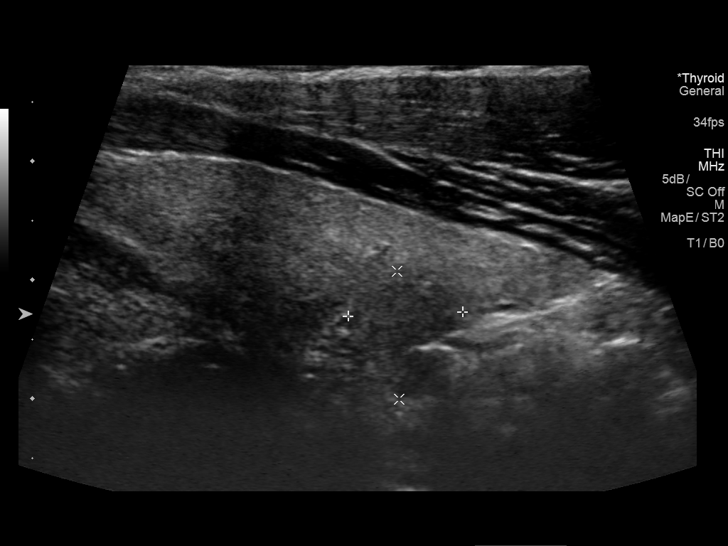
[im 4/45]
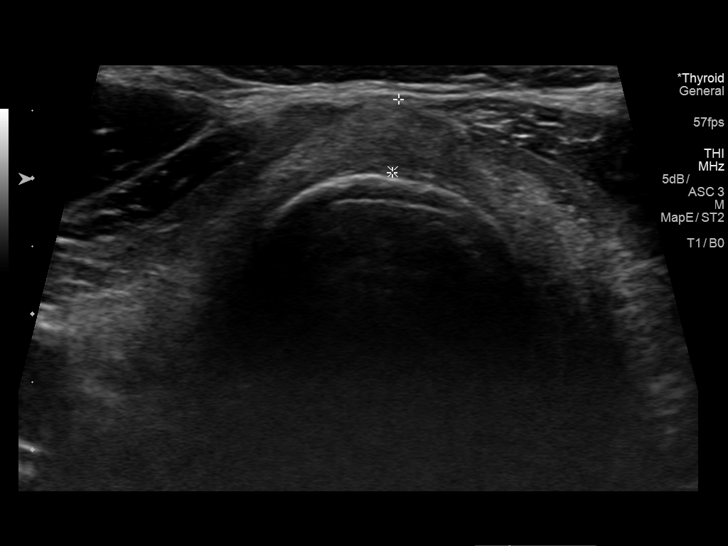
[im 8/45]
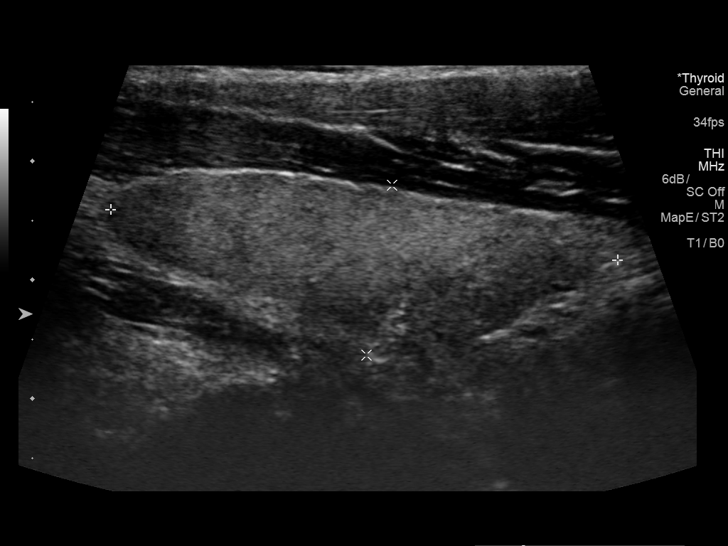
[im 12/45]
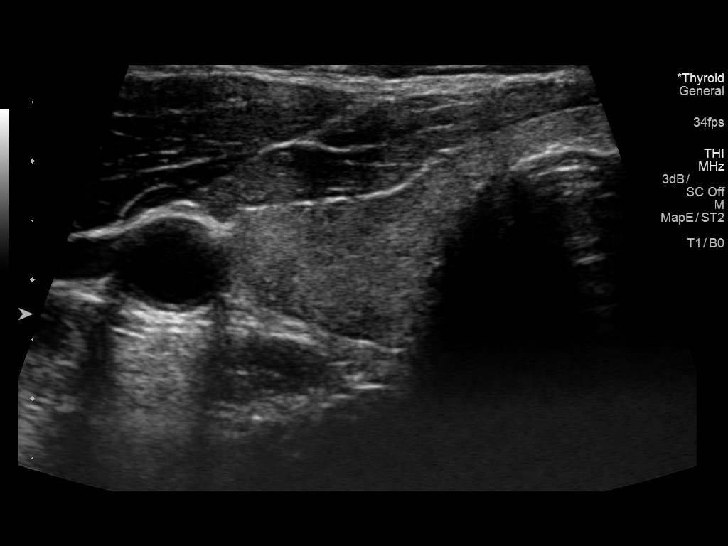
[im 15/45]
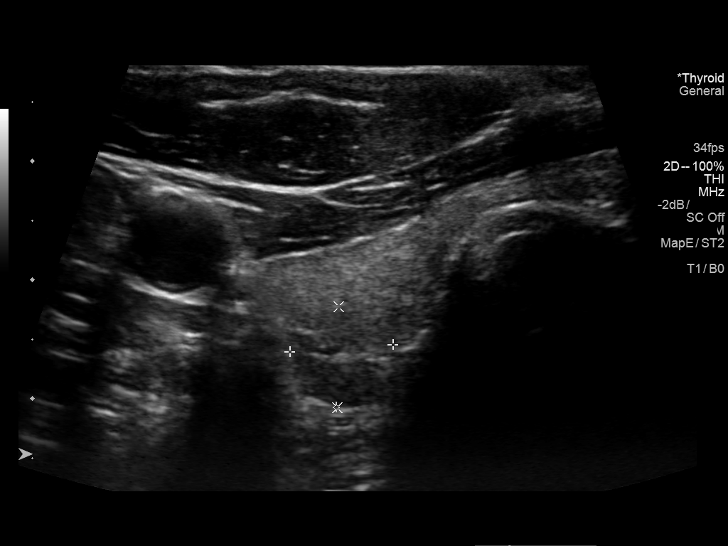
[im 19/45]
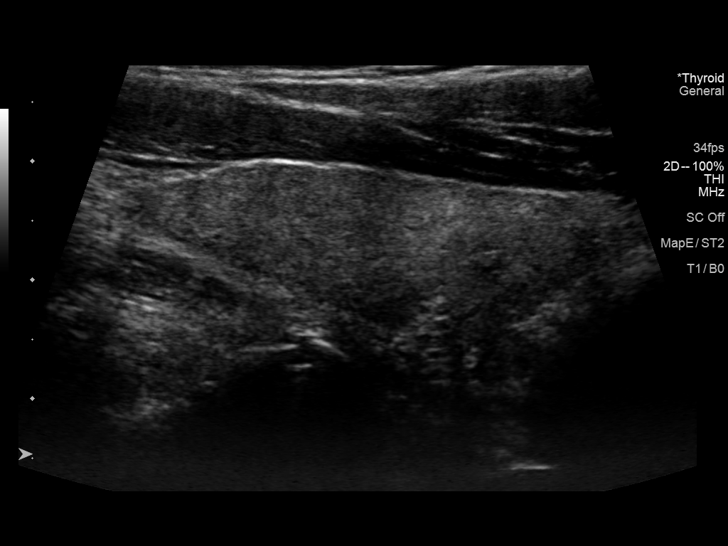
[im 23/45]
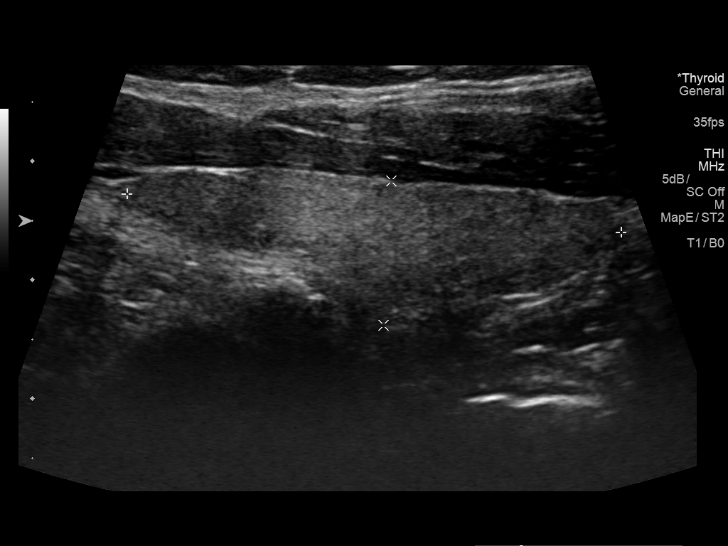
[im 26/45]
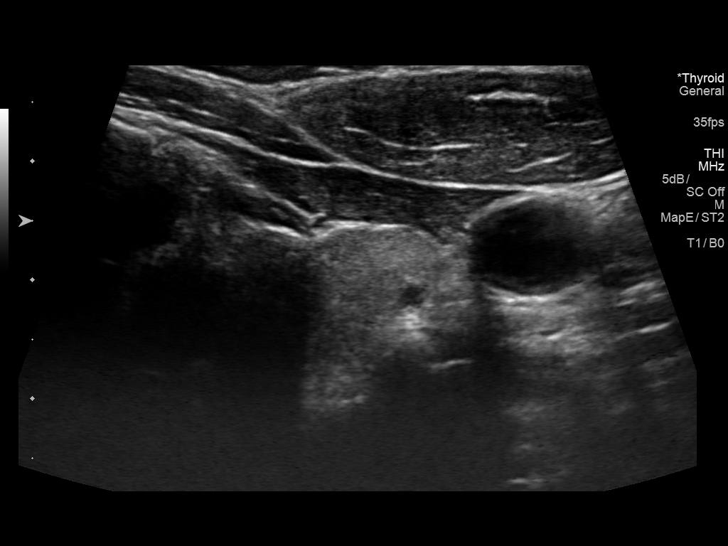
[im 30/45]
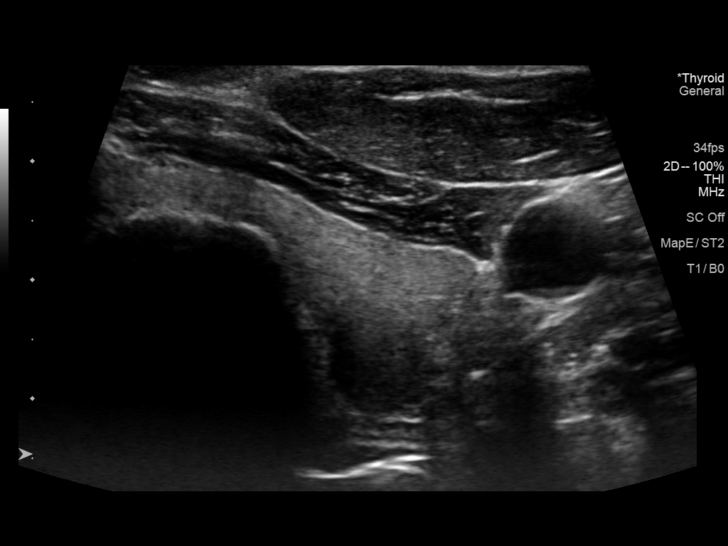
[im 34/45]
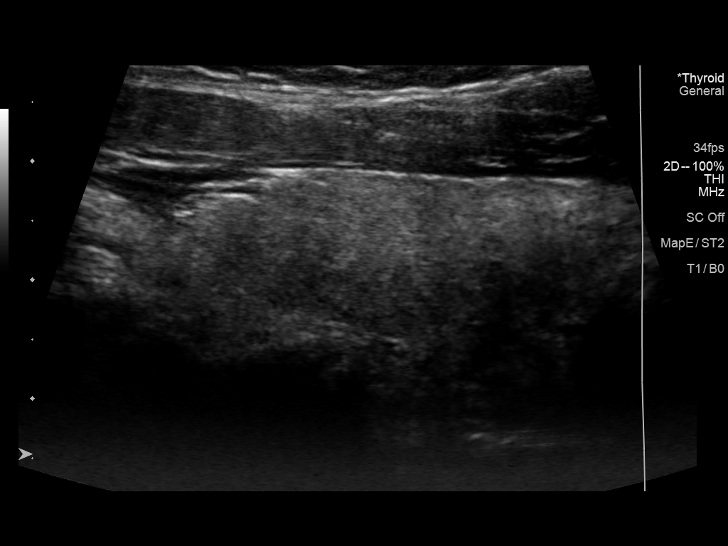
[im 37/45]
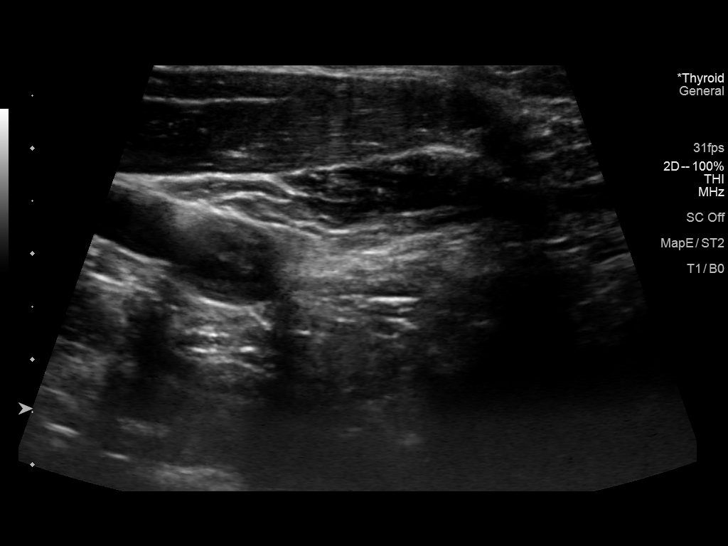
[im 41/45]
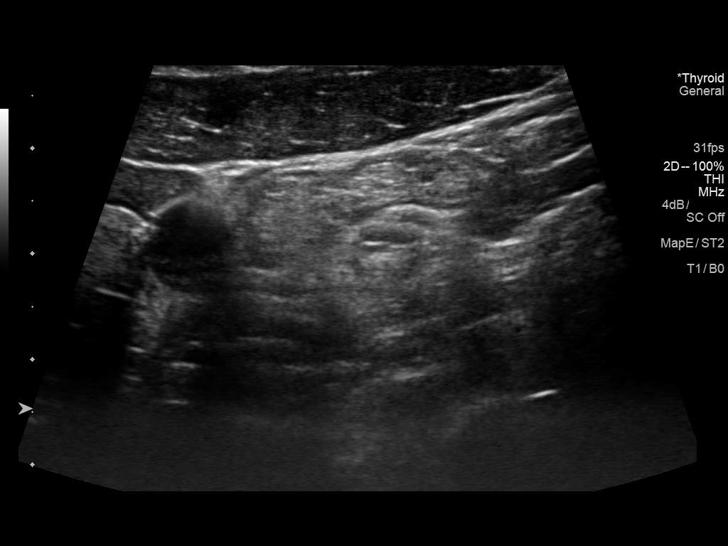
[im 45/45]
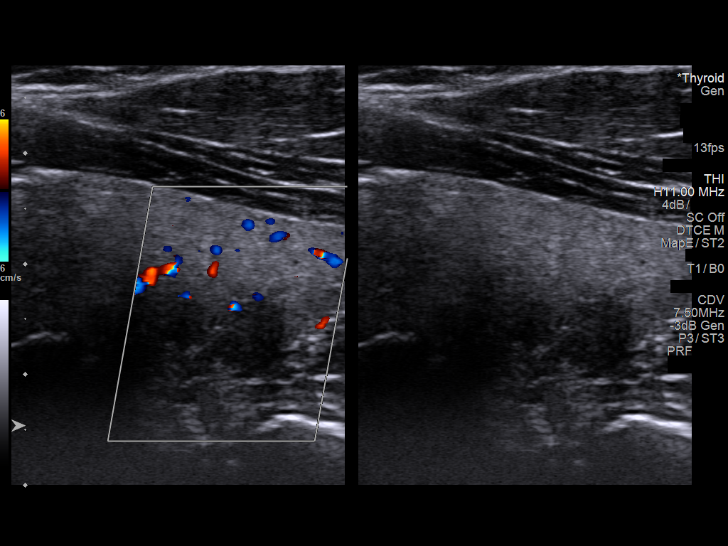

[13 of 25 positions shown; findings below may reference images not displayed]

FINDINGS: Parenchymal Echotexture: Mildly heterogenous

Isthmus: If 0.5 cm thickness

Right lobe: 4.3 x 1.5 x 1.5 cm

Left lobe: 4.2 x 1.2 x 1.7 cm

_________________________________________________________

Estimated total number of nodules >/= 1 cm: 1

Number of spongiform nodules >/=  2 cm not described below (TR1): 0

Number of mixed cystic and solid nodules >/= 1.5 cm not described
below (TR2): 0 if

_________________________________________________________

Nodule # 1:

Location: Right; Mid posterior

Maximum size: 1 cm; Other 2 dimensions: 1 x 0.9 cm

Composition: solid/almost completely solid (2)

Echogenicity: hypoechoic (2)

Shape: not taller-than-wide (0)

Margins: ill-defined (0)

Echogenic foci: none (0)

ACR TI-RADS total points: 4.

ACR TI-RADS risk category: TR4 (4-6 points).

ACR TI-RADS recommendations:

*Given size (>/= 1 - 1.4 cm) and appearance, a follow-up ultrasound
in 1 year should be considered based on TI-RADS criteria.

_________________________________________________________

0.9 cm hypoechoic nodule without calcifications, inferior left.
IMPRESSION: 1. Normal-sized thyroid with small bilateral nodules. Recommend 1
year surveillance ultrasound.

The above is in keeping with the ACR TI-RADS recommendations - [HOSPITAL] 0371;[DATE].

## 2019-10-05 ENCOUNTER — Other Ambulatory Visit: Payer: Self-pay

## 2019-10-05 MED ORDER — POTASSIUM CHLORIDE CRYS ER 20 MEQ PO TBCR: 40.0000 meq | EXTENDED_RELEASE_TABLET | Freq: Every day | ORAL | 1 refills | Status: AC

## 2019-11-02 ENCOUNTER — Encounter: Payer: Self-pay | Admitting: Internal Medicine

## 2021-03-06 ENCOUNTER — Encounter: Payer: Self-pay | Admitting: Internal Medicine
